# Patient Record
Sex: Female | Born: 1935 | Race: White | Hispanic: No | Smoking: Former smoker
Health system: Southern US, Community
[De-identification: ages and names within clinical notes are randomized; demographics above are authoritative.]

## PROBLEM LIST (undated history)

## (undated) ENCOUNTER — Emergency Department (HOSPITAL_COMMUNITY): Admission: EM | Payer: BC Managed Care – PPO | Source: Home / Self Care

## (undated) DIAGNOSIS — E785 Hyperlipidemia, unspecified: Secondary | ICD-10-CM

## (undated) DIAGNOSIS — I10 Essential (primary) hypertension: Secondary | ICD-10-CM

## (undated) DIAGNOSIS — E119 Type 2 diabetes mellitus without complications: Secondary | ICD-10-CM

## (undated) DIAGNOSIS — K922 Gastrointestinal hemorrhage, unspecified: Secondary | ICD-10-CM

## (undated) DIAGNOSIS — N189 Chronic kidney disease, unspecified: Secondary | ICD-10-CM

## (undated) DIAGNOSIS — I2510 Atherosclerotic heart disease of native coronary artery without angina pectoris: Secondary | ICD-10-CM

## (undated) HISTORY — DX: Essential (primary) hypertension: I10

## (undated) HISTORY — PX: ABDOMINAL HYSTERECTOMY: SHX81

## (undated) HISTORY — DX: Hyperlipidemia, unspecified: E78.5

## (undated) HISTORY — PX: CHOLECYSTECTOMY: SHX55

## (undated) HISTORY — DX: Type 2 diabetes mellitus without complications: E11.9

---

## 1999-08-13 ENCOUNTER — Encounter: Admission: RE | Admit: 1999-08-13 | Discharge: 1999-08-13 | Payer: Self-pay | Admitting: Family Medicine

## 1999-08-13 ENCOUNTER — Encounter: Payer: Self-pay | Admitting: Family Medicine

## 2000-09-30 ENCOUNTER — Encounter: Payer: Self-pay | Admitting: Family Medicine

## 2000-09-30 ENCOUNTER — Encounter: Admission: RE | Admit: 2000-09-30 | Discharge: 2000-09-30 | Payer: Self-pay | Admitting: Family Medicine

## 2001-10-13 ENCOUNTER — Encounter: Payer: Self-pay | Admitting: Family Medicine

## 2001-10-13 ENCOUNTER — Encounter: Admission: RE | Admit: 2001-10-13 | Discharge: 2001-10-13 | Payer: Self-pay | Admitting: Family Medicine

## 2002-11-23 ENCOUNTER — Encounter: Payer: Self-pay | Admitting: Family Medicine

## 2002-11-23 ENCOUNTER — Encounter: Admission: RE | Admit: 2002-11-23 | Discharge: 2002-11-23 | Payer: Self-pay | Admitting: Family Medicine

## 2004-01-17 ENCOUNTER — Ambulatory Visit (HOSPITAL_COMMUNITY): Admission: RE | Admit: 2004-01-17 | Discharge: 2004-01-17 | Payer: Self-pay | Admitting: Family Medicine

## 2005-07-08 ENCOUNTER — Ambulatory Visit (HOSPITAL_COMMUNITY): Admission: RE | Admit: 2005-07-08 | Discharge: 2005-07-08 | Payer: Self-pay | Admitting: Family Medicine

## 2013-06-22 ENCOUNTER — Ambulatory Visit: Payer: Self-pay | Admitting: *Deleted

## 2013-06-28 ENCOUNTER — Encounter: Payer: Self-pay | Admitting: *Deleted

## 2013-06-28 ENCOUNTER — Encounter: Payer: Medicare Other | Attending: Family Medicine | Admitting: *Deleted

## 2013-06-28 DIAGNOSIS — Z713 Dietary counseling and surveillance: Secondary | ICD-10-CM | POA: Insufficient documentation

## 2013-06-28 DIAGNOSIS — E119 Type 2 diabetes mellitus without complications: Secondary | ICD-10-CM | POA: Insufficient documentation

## 2013-06-28 NOTE — Progress Notes (Signed)
Appt start time: 1500 end time:  1630.  Assessment:  Patient was seen on  06/28/13 for individual diabetes education. Patient states she is new to diabetes, she does not have a meter yet and has not had any diabetes education before. She declined to be weighed today stating it makes her depressed. She states she tried to take Januvia but she had bad problem with GI upset. She doesn't want to take any oral diabetes medication after that problem. She is taking Glimeperide successfully so far. She declines any symptoms including polyuria and polydipsia. She does not get activity due to some dizziness and she does not enjoy being outside. She doesn't cook for herself due to small kitchen and concerns over smoke alarm going off in her apartment.  Current HbA1c: 11.4%  Preferred Learning Style:   No preference indicated   Learning Readiness:   Contemplating  MEDICATIONS: see list. Diabetes medication is Glimeperide  DIETARY INTAKE:  24-hr recall:  B ( AM): cereal (great grains with nuts and cranberries OR mini shredded wheat), 1-2% milk, coffee with creamer  Snk ( AM): pickles and Lance crackers L ( PM): varies, usually a sandwich with whole grain bread OR frozen dinner OR salad from [employer] with lots of vegetables in it, OR bowl of [employer] soup OR sweet iced tea with lemon Snk ( PM): no  D ( PM): meal brought home from [employer] OR frozen meal with meat, starch, vegetable and bread Snk ( PM): not usually Beverages: coffee, sweet tea, diet soda  Usual physical activity: none  Estimated energy needs: 1400 calories 158 g carbohydrates 105 g protein 39 g fat    Intervention:  Nutrition counseling attempteded. I initiated instruction but she became tired after about 45 minutes so we had to cut the visit short. She stated she would read the material in The Liviing Well With Diabetes Book and call me if she had any questions. Information covered today include:  Discussed diabetes disease process and  treatment options.  Discussed basic physiology of diabetes and role of obesity on insulin resistance.  Encouraged moderate weight reduction to improve glucose levels.  Discussed role of medications and diet in glucose control  Reviewed patient medications.  Discussed role of medication on blood glucose and possible side effects  Described short-term complications: hyper- and hypo-glycemia.  Discussed causes,symptoms, and treatment options.  Teaching Method Utilized: Visual and Auditory   Handouts given during visit include: Living Well with Diabetes  Barriers to learning/adherence to lifestyle change: elderly living alone and some denial to having diabetes  Diabetes self-care support plan:   [facility] support group  She depends on her daughter who lives out of state to provide her with information on her diabetes and other medical issues  Demonstrated degree of understanding via:  Teach Back   Monitoring/Evaluation:  Dietary intake, exercise, reading food labels, and body weight prn. She declined to make a follow up appointment but did take my card so she can contact me when she is ready to continue.

## 2016-09-07 ENCOUNTER — Encounter (HOSPITAL_COMMUNITY)
Admission: RE | Disposition: A | Discharge status: Home Health | Payer: Self-pay | Source: Ambulatory Visit | Attending: Interventional Cardiology

## 2016-09-07 ENCOUNTER — Inpatient Hospital Stay (HOSPITAL_COMMUNITY)
Admission: RE | Admit: 2016-09-07 | Discharge: 2016-09-13 | DRG: 246 | Disposition: A | Discharge status: Home Health | Payer: Medicare Other | Source: Ambulatory Visit | Attending: Interventional Cardiology | Admitting: Interventional Cardiology

## 2016-09-07 ENCOUNTER — Inpatient Hospital Stay (HOSPITAL_COMMUNITY): Payer: Medicare Other

## 2016-09-07 DIAGNOSIS — I959 Hypotension, unspecified: Secondary | ICD-10-CM | POA: Diagnosis not present

## 2016-09-07 DIAGNOSIS — Z79899 Other long term (current) drug therapy: Secondary | ICD-10-CM

## 2016-09-07 DIAGNOSIS — E872 Acidosis: Secondary | ICD-10-CM | POA: Diagnosis present

## 2016-09-07 DIAGNOSIS — N189 Chronic kidney disease, unspecified: Secondary | ICD-10-CM | POA: Diagnosis not present

## 2016-09-07 DIAGNOSIS — E1122 Type 2 diabetes mellitus with diabetic chronic kidney disease: Secondary | ICD-10-CM | POA: Diagnosis present

## 2016-09-07 DIAGNOSIS — R55 Syncope and collapse: Secondary | ICD-10-CM | POA: Diagnosis not present

## 2016-09-07 DIAGNOSIS — Z90710 Acquired absence of both cervix and uterus: Secondary | ICD-10-CM

## 2016-09-07 DIAGNOSIS — N179 Acute kidney failure, unspecified: Secondary | ICD-10-CM | POA: Diagnosis present

## 2016-09-07 DIAGNOSIS — Z794 Long term (current) use of insulin: Secondary | ICD-10-CM | POA: Diagnosis not present

## 2016-09-07 DIAGNOSIS — I2111 ST elevation (STEMI) myocardial infarction involving right coronary artery: Secondary | ICD-10-CM | POA: Diagnosis not present

## 2016-09-07 DIAGNOSIS — E785 Hyperlipidemia, unspecified: Secondary | ICD-10-CM | POA: Diagnosis present

## 2016-09-07 DIAGNOSIS — I2119 ST elevation (STEMI) myocardial infarction involving other coronary artery of inferior wall: Secondary | ICD-10-CM | POA: Diagnosis present

## 2016-09-07 DIAGNOSIS — K922 Gastrointestinal hemorrhage, unspecified: Secondary | ICD-10-CM | POA: Diagnosis present

## 2016-09-07 DIAGNOSIS — K219 Gastro-esophageal reflux disease without esophagitis: Secondary | ICD-10-CM | POA: Diagnosis present

## 2016-09-07 DIAGNOSIS — R110 Nausea: Secondary | ICD-10-CM | POA: Diagnosis present

## 2016-09-07 DIAGNOSIS — R001 Bradycardia, unspecified: Secondary | ICD-10-CM | POA: Diagnosis present

## 2016-09-07 DIAGNOSIS — K921 Melena: Secondary | ICD-10-CM | POA: Diagnosis not present

## 2016-09-07 DIAGNOSIS — E875 Hyperkalemia: Secondary | ICD-10-CM | POA: Diagnosis present

## 2016-09-07 DIAGNOSIS — Z9049 Acquired absence of other specified parts of digestive tract: Secondary | ICD-10-CM

## 2016-09-07 DIAGNOSIS — N170 Acute kidney failure with tubular necrosis: Secondary | ICD-10-CM

## 2016-09-07 DIAGNOSIS — Z6835 Body mass index (BMI) 35.0-35.9, adult: Secondary | ICD-10-CM

## 2016-09-07 DIAGNOSIS — R570 Cardiogenic shock: Secondary | ICD-10-CM | POA: Diagnosis present

## 2016-09-07 DIAGNOSIS — I2510 Atherosclerotic heart disease of native coronary artery without angina pectoris: Secondary | ICD-10-CM | POA: Diagnosis present

## 2016-09-07 DIAGNOSIS — E784 Other hyperlipidemia: Secondary | ICD-10-CM | POA: Diagnosis not present

## 2016-09-07 DIAGNOSIS — D62 Acute posthemorrhagic anemia: Secondary | ICD-10-CM | POA: Diagnosis not present

## 2016-09-07 DIAGNOSIS — N172 Acute kidney failure with medullary necrosis: Secondary | ICD-10-CM | POA: Diagnosis not present

## 2016-09-07 DIAGNOSIS — E871 Hypo-osmolality and hyponatremia: Secondary | ICD-10-CM | POA: Diagnosis present

## 2016-09-07 DIAGNOSIS — E669 Obesity, unspecified: Secondary | ICD-10-CM | POA: Diagnosis present

## 2016-09-07 DIAGNOSIS — Z955 Presence of coronary angioplasty implant and graft: Secondary | ICD-10-CM

## 2016-09-07 DIAGNOSIS — E118 Type 2 diabetes mellitus with unspecified complications: Secondary | ICD-10-CM | POA: Diagnosis not present

## 2016-09-07 DIAGNOSIS — N183 Chronic kidney disease, stage 3 (moderate): Secondary | ICD-10-CM

## 2016-09-07 DIAGNOSIS — R9431 Abnormal electrocardiogram [ECG] [EKG]: Secondary | ICD-10-CM | POA: Diagnosis not present

## 2016-09-07 DIAGNOSIS — E782 Mixed hyperlipidemia: Secondary | ICD-10-CM | POA: Diagnosis not present

## 2016-09-07 DIAGNOSIS — E1121 Type 2 diabetes mellitus with diabetic nephropathy: Secondary | ICD-10-CM | POA: Diagnosis not present

## 2016-09-07 DIAGNOSIS — D696 Thrombocytopenia, unspecified: Secondary | ICD-10-CM | POA: Diagnosis present

## 2016-09-07 DIAGNOSIS — I10 Essential (primary) hypertension: Secondary | ICD-10-CM | POA: Diagnosis not present

## 2016-09-07 DIAGNOSIS — E119 Type 2 diabetes mellitus without complications: Secondary | ICD-10-CM

## 2016-09-07 DIAGNOSIS — I219 Acute myocardial infarction, unspecified: Secondary | ICD-10-CM

## 2016-09-07 DIAGNOSIS — I129 Hypertensive chronic kidney disease with stage 1 through stage 4 chronic kidney disease, or unspecified chronic kidney disease: Secondary | ICD-10-CM | POA: Diagnosis present

## 2016-09-07 HISTORY — PX: CORONARY STENT INTERVENTION: CATH118234

## 2016-09-07 HISTORY — DX: Chronic kidney disease, unspecified: N18.9

## 2016-09-07 HISTORY — DX: Gastrointestinal hemorrhage, unspecified: K92.2

## 2016-09-07 HISTORY — DX: Atherosclerotic heart disease of native coronary artery without angina pectoris: I25.10

## 2016-09-07 HISTORY — PX: LEFT HEART CATH AND CORONARY ANGIOGRAPHY: CATH118249

## 2016-09-07 LAB — LIPID PANEL
Cholesterol: 165 mg/dL (ref 0–200)
HDL: 35 mg/dL — AB (ref >40)
LDL CALC: 107 mg/dL — AB (ref 0–99)
TRIGLYCERIDES: 114 mg/dL (ref <150)
Total CHOL/HDL Ratio: 4.7 RATIO
VLDL: 23 mg/dL (ref 0–40)

## 2016-09-07 LAB — COMPREHENSIVE METABOLIC PANEL
ALK PHOS: 55 U/L (ref 38–126)
ALT: 25 U/L (ref 14–54)
ANION GAP: 9 (ref 5–15)
AST: 118 U/L — ABNORMAL HIGH (ref 15–41)
Albumin: 3 g/dL — ABNORMAL LOW (ref 3.5–5.0)
BUN: 29 mg/dL — ABNORMAL HIGH (ref 6–20)
CO2: 18 mmol/L — AB (ref 22–32)
Calcium: 8 mg/dL — ABNORMAL LOW (ref 8.9–10.3)
Chloride: 100 mmol/L — ABNORMAL LOW (ref 101–111)
Creatinine, Ser: 2.85 mg/dL — ABNORMAL HIGH (ref 0.44–1.00)
GFR calc non Af Amer: 15 mL/min — ABNORMAL LOW (ref >60)
GFR, EST AFRICAN AMERICAN: 17 mL/min — AB (ref >60)
Glucose, Bld: 113 mg/dL — ABNORMAL HIGH (ref 65–99)
POTASSIUM: 4.7 mmol/L (ref 3.5–5.1)
SODIUM: 127 mmol/L — AB (ref 135–145)
Total Bilirubin: 0.4 mg/dL (ref 0.3–1.2)
Total Protein: 5.4 g/dL — ABNORMAL LOW (ref 6.5–8.1)

## 2016-09-07 LAB — BRAIN NATRIURETIC PEPTIDE: B Natriuretic Peptide: 406.2 pg/mL — ABNORMAL HIGH (ref 0.0–100.0)

## 2016-09-07 LAB — CBC
HEMATOCRIT: 28.7 % — AB (ref 36.0–46.0)
Hemoglobin: 9.6 g/dL — ABNORMAL LOW (ref 12.0–15.0)
MCH: 29.9 pg (ref 26.0–34.0)
MCHC: 33.4 g/dL (ref 30.0–36.0)
MCV: 89.4 fL (ref 78.0–100.0)
Platelets: 157 10*3/uL (ref 150–400)
RBC: 3.21 MIL/uL — ABNORMAL LOW (ref 3.87–5.11)
RDW: 14.4 % (ref 11.5–15.5)
WBC: 9.7 10*3/uL (ref 4.0–10.5)

## 2016-09-07 LAB — PROTIME-INR
INR: 1.44
Prothrombin Time: 17.7 seconds — ABNORMAL HIGH (ref 11.4–15.2)

## 2016-09-07 LAB — APTT: aPTT: >200 seconds (ref 24–36)

## 2016-09-07 LAB — TROPONIN I: Troponin I: 36.62 ng/mL (ref <0.03)

## 2016-09-07 SURGERY — LEFT HEART CATH AND CORONARY ANGIOGRAPHY
Anesthesia: LOCAL

## 2016-09-07 MED ORDER — TIROFIBAN (AGGRASTAT) BOLUS VIA INFUSION
INTRAVENOUS | Status: DC | PRN
  Administered 2016-09-07: 2175 ug via INTRAVENOUS

## 2016-09-07 MED ORDER — CLOPIDOGREL BISULFATE 75 MG PO TABS
75.0000 mg | ORAL_TABLET | Freq: Every day | ORAL | Status: DC
  Administered 2016-09-08 – 2016-09-13 (×6): 75 mg via ORAL
  Filled 2016-09-07 (×6): qty 1

## 2016-09-07 MED ORDER — SODIUM CHLORIDE 0.9 % IV SOLN
INTRAVENOUS | Status: AC | PRN
  Administered 2016-09-07: 250 mL/h via INTRAVENOUS

## 2016-09-07 MED ORDER — SODIUM CHLORIDE 0.9 % IV SOLN
250.0000 mL | INTRAVENOUS | Status: DC | PRN
  Administered 2016-09-09: 250 mL via INTRAVENOUS

## 2016-09-07 MED ORDER — ACETAMINOPHEN 325 MG PO TABS
650.0000 mg | ORAL_TABLET | ORAL | Status: DC | PRN
  Administered 2016-09-07 – 2016-09-11 (×3): 650 mg via ORAL
  Filled 2016-09-07 (×3): qty 2

## 2016-09-07 MED ORDER — SODIUM CHLORIDE 0.9 % IV SOLN
INTRAVENOUS | Status: AC
  Administered 2016-09-08: 05:00:00 via INTRAVENOUS

## 2016-09-07 MED ORDER — ONDANSETRON HCL 4 MG/2ML IJ SOLN
INTRAMUSCULAR | Status: DC | PRN
  Administered 2016-09-07: 4 mg via INTRAVENOUS

## 2016-09-07 MED ORDER — TIROFIBAN HCL IN NACL 5-0.9 MG/100ML-% IV SOLN
INTRAVENOUS | Status: AC | PRN
  Administered 2016-09-07: 0.075 ug/kg/min via INTRAVENOUS

## 2016-09-07 MED ORDER — LABETALOL HCL 5 MG/ML IV SOLN: 10.0000 mg | INTRAVENOUS | Status: AC | PRN

## 2016-09-07 MED ORDER — DOPAMINE-DEXTROSE 3.2-5 MG/ML-% IV SOLN
INTRAVENOUS | Status: AC | PRN
  Administered 2016-09-07: 3 ug/kg/min via INTRAVENOUS

## 2016-09-07 MED ORDER — ONDANSETRON HCL 4 MG/2ML IJ SOLN
4.0000 mg | Freq: Four times a day (QID) | INTRAMUSCULAR | Status: DC | PRN
  Administered 2016-09-08 (×3): 4 mg via INTRAVENOUS
  Filled 2016-09-07 (×3): qty 2

## 2016-09-07 MED ORDER — HEPARIN SODIUM (PORCINE) 1000 UNIT/ML IJ SOLN
INTRAMUSCULAR | Status: DC | PRN
  Administered 2016-09-07: 3000 [IU] via INTRAVENOUS
  Administered 2016-09-07: 5000 [IU] via INTRAVENOUS
  Administered 2016-09-07: 4000 [IU] via INTRAVENOUS

## 2016-09-07 MED ORDER — SODIUM CHLORIDE 0.9% FLUSH
3.0000 mL | Freq: Two times a day (BID) | INTRAVENOUS | Status: DC
  Administered 2016-09-08: 10 mL via INTRAVENOUS
  Administered 2016-09-08 – 2016-09-12 (×8): 3 mL via INTRAVENOUS

## 2016-09-07 MED ORDER — TIROFIBAN HCL IV 12.5 MG/250 ML
0.0750 ug/kg/min | INTRAVENOUS | Status: DC
  Administered 2016-09-08: 0.075 ug/kg/min via INTRAVENOUS
  Filled 2016-09-07: qty 250

## 2016-09-07 MED ORDER — MIDAZOLAM HCL 2 MG/2ML IJ SOLN
INTRAMUSCULAR | Status: DC | PRN
  Administered 2016-09-07: 1 mg via INTRAVENOUS

## 2016-09-07 MED ORDER — IOPAMIDOL (ISOVUE-370) INJECTION 76%
INTRAVENOUS | Status: DC | PRN
  Administered 2016-09-07: 105 mL via INTRA_ARTERIAL

## 2016-09-07 MED ORDER — CLOPIDOGREL BISULFATE 75 MG PO TABS
600.0000 mg | ORAL_TABLET | Freq: Once | ORAL | Status: AC
  Administered 2016-09-08: 600 mg via ORAL
  Filled 2016-09-07: qty 8

## 2016-09-07 MED ORDER — HEPARIN (PORCINE) IN NACL 2-0.9 UNIT/ML-% IJ SOLN
INTRAMUSCULAR | Status: AC | PRN
  Administered 2016-09-07: 1000 mL

## 2016-09-07 MED ORDER — ATROPINE SULFATE 1 MG/10ML IJ SOSY
PREFILLED_SYRINGE | INTRAMUSCULAR | Status: DC | PRN
  Administered 2016-09-07: 1 mg via INTRAVENOUS

## 2016-09-07 MED ORDER — SODIUM CHLORIDE 0.9% FLUSH: 3.0000 mL | INTRAVENOUS | Status: DC | PRN

## 2016-09-07 MED ORDER — OXYCODONE-ACETAMINOPHEN 5-325 MG PO TABS
1.0000 | ORAL_TABLET | ORAL | Status: DC | PRN
  Administered 2016-09-08 (×2): 1 via ORAL
  Filled 2016-09-07 (×2): qty 1

## 2016-09-07 MED ORDER — HEPARIN SODIUM (PORCINE) 5000 UNIT/ML IJ SOLN
5000.0000 [IU] | Freq: Three times a day (TID) | INTRAMUSCULAR | Status: DC
  Administered 2016-09-08: 5000 [IU] via SUBCUTANEOUS
  Filled 2016-09-07: qty 1

## 2016-09-07 MED ORDER — DOPAMINE-DEXTROSE 3.2-5 MG/ML-% IV SOLN
2.0000 ug/kg/min | INTRAVENOUS | Status: DC
  Administered 2016-09-08: 11.5 ug/kg/min via INTRAVENOUS
  Administered 2016-09-09: 9 ug/kg/min via INTRAVENOUS
  Administered 2016-09-09: 8 ug/kg/min via INTRAVENOUS
  Filled 2016-09-07 (×2): qty 250

## 2016-09-07 MED ORDER — VERAPAMIL HCL 2.5 MG/ML IV SOLN
INTRAVENOUS | Status: DC | PRN
  Administered 2016-09-07: 10 mL via INTRA_ARTERIAL

## 2016-09-07 MED ORDER — LIDOCAINE HCL (PF) 1 % IJ SOLN
INTRAMUSCULAR | Status: DC | PRN
  Administered 2016-09-07: 2 mL via INTRADERMAL

## 2016-09-07 MED ORDER — HYDRALAZINE HCL 20 MG/ML IJ SOLN: 5.0000 mg | INTRAMUSCULAR | Status: AC | PRN

## 2016-09-07 MED ORDER — ASPIRIN 81 MG PO CHEW
81.0000 mg | CHEWABLE_TABLET | Freq: Every day | ORAL | Status: DC
  Administered 2016-09-08: 81 mg via ORAL
  Filled 2016-09-07: qty 1

## 2016-09-07 SURGICAL SUPPLY — 17 items
BALLN EUPHORA RX 2.0X15 (BALLOONS) ×2
BALLOON EUPHORA RX 2.0X15 (BALLOONS) IMPLANT
CATH INFINITI 5 FR JL3.5 (CATHETERS) ×1 IMPLANT
CATH INFINITI JR4 5F (CATHETERS) ×1 IMPLANT
CATH VISTA GUIDE 6FR JR4 (CATHETERS) ×1 IMPLANT
DEVICE RAD COMP TR BAND LRG (VASCULAR PRODUCTS) ×1 IMPLANT
ELECT DEFIB PAD ADLT CADENCE (PAD) ×1 IMPLANT
GLIDESHEATH SLEND A-KIT 6F 22G (SHEATH) ×1 IMPLANT
GUIDEWIRE INQWIRE 1.5J.035X260 (WIRE) IMPLANT
INQWIRE 1.5J .035X260CM (WIRE) ×2
KIT ENCORE 26 ADVANTAGE (KITS) ×1 IMPLANT
KIT HEART LEFT (KITS) ×2 IMPLANT
PACK CARDIAC CATHETERIZATION (CUSTOM PROCEDURE TRAY) ×2 IMPLANT
STENT PROMUS PREM MR 3.5X24 (Permanent Stent) ×1 IMPLANT
TRANSDUCER W/STOPCOCK (MISCELLANEOUS) ×2 IMPLANT
TUBING CIL FLEX 10 FLL-RA (TUBING) ×2 IMPLANT
WIRE ASAHI PROWATER 180CM (WIRE) ×1 IMPLANT

## 2016-09-07 NOTE — H&P (Addendum)
Grace Romero is a 81 y.o. female  Admit Date: 09/07/2016 Referring Physician: EMS STEMI activation Primary Cardiologist:: New Chief complaint / reason for admission: Inferior STEMI   HPI: 81 year old female with no prior records available in our system presented via EMS as a code STEMI with inferior ST elevation and bradycardia.  At around 4 PM on 09/06/16, the patient developed epigastric, chest, and neck discomfort. She went to bed. She slept off and on. The discomfort lasted severely greater than 6 hours. By this morning, 12 hours later the discomfort had resolved but she felt weak. She continued to feel weak throughout the day and would nearly fainted when she would try to stand and ambulate. She called EMS who arrived, performed an EKG, and documented an inferior ST elevation. The patient was having mild residual neck and chest discomfort. She was met in the ambulance bay, appeared pale, was not dyspneic, but complained of hurting all over. She was taken directly to the cardiac catheterization laboratory.  Initial presentation to EMS was with blood pressure of 80 mmHg systolic which responded to 250 cc bolus of normal saline.    PMH:    Past Medical History:  Diagnosis Date  . Diabetes mellitus without complication   . Hyperlipidemia   . Hypertension     PSH:    Past Surgical History:  Procedure Laterality Date  . ABDOMINAL HYSTERECTOMY    . CHOLECYSTECTOMY     ALLERGIES:   Patient has no known allergies. Prior to Admit Meds:   Prescriptions Prior to Admission  Medication Sig Dispense Refill Last Dose  . amlodipine-benazepril (LOTREL) 2.5-10 MG per capsule Take 1 capsule by mouth daily.     Marland Kitchen glimepiride (AMARYL) 2 MG tablet Take 2 mg by mouth daily with breakfast.     . lisinopril (PRINIVIL,ZESTRIL) 20 MG tablet Take 20 mg by mouth daily.     Marland Kitchen omeprazole (PRILOSEC) 20 MG capsule Take 20 mg by mouth daily.     . simvastatin (ZOCOR) 20 MG tablet Take 20 mg by  mouth daily.     . verapamil (CALAN) 120 MG tablet Take 120 mg by mouth 3 (three) times daily.      Family HX:   No family history on file. Social HX:    Social History   Social History  . Marital status: Divorced    Spouse name: N/A  . Number of children: N/A  . Years of education: N/A   Occupational History  . Not on file.   Social History Main Topics  . Smoking status: Not on file  . Smokeless tobacco: Not on file  . Alcohol use Not on file  . Drug use: Unknown  . Sexual activity: Not on file   Other Topics Concern  . Not on file   Social History Narrative  . No narrative on file     ROS Smoke cigarettes. No history of COPD. Does not see her physician regularly. Mild nausea earlier today. Difficulty sleeping. Arthritis in both knees. All other systems are negative.  Physical Exam: Blood pressure (!) 119/53, pulse (!) 0, resp. rate (!) 0, height '5\' 2"'$  (1.575 m), weight 193 lb 2 oz (87.6 kg), SpO2 (!) 0 %.    The patient is pale, cool, and slightly clammy. Pupils equal and reactive to light. No jaundice is noted. Neck exam is difficult and there is no obvious JVD with the patient lying at 39 of the emergency gurney. No carotid bruits are heard.  Chest is clear to auscultation and percussion. Cardiac exam reveals a soft S4 gallop but otherwise unremarkable. No rub is heard. Abdomen is markedly obese. No tenderness is noted. Soft bowel sounds are heard. Extremities reveal no edema. Radial pulses are thready but palpable. Dorsalis pedis pulses are 2+ and symmetric. Neurologically, the patient is responsive, able to follow commands, and has no focal defects.   Labs: Lab Results  Component Value Date   WBC 9.7 09/07/2016   HGB 9.6 (L) 09/07/2016   HCT 28.7 (L) 09/07/2016   MCV 89.4 09/07/2016   PLT 157 09/07/2016   No results for input(s): NA, K, CL, CO2, BUN, CREATININE, CALCIUM, PROT, BILITOT, ALKPHOS, ALT, AST, GLUCOSE in the last 168 hours.  Invalid input(s):  LABALBU No results found for: CKTOTAL, [place], [place], [place]    Radiology:  No results found.  EKG:  Sinus bradycardia, ST elevation 2, 3, and aVF. ST elevation also noted in V6.  ASSESSMENT:  1. Late presenting inferior ST elevation myocardial infarction with ongoing relatively vague complaints. She has not greater than 24 hours post symptom onset. Perhaps continuing vague complaints are related to vessel occlusion with collaterals. 2. Diabetes mellitus, type II without much detail from the patient concerning control. 3. Tobacco abuse 4. Presumed hyperlipidemia 5. Initial hypotension, responding to IV normal saline bolus  Plan:  Diabetic patient with evidence of inferior infarction and ongoing vague complaints.  The patient was counseled to undergo left heart catheterization, coronary angiography, and possible percutaneous coronary intervention with stent implantation. The procedural risks and benefits were discussed in detail. The risks discussed included death, stroke, myocardial infarction, life-threatening bleeding, limb ischemia, kidney injury, allergy, and possible emergency cardiac surgery. The risk of these significant complications were estimated to occur less than 1% of the time. After discussion, the patient has agreed to proceed.  IV fluid will be given to combat tendency towards hypotension, possibly in the setting of right ventricular involvement.  Critical care time 35 minutes.  Belva Crome III 09/07/2016 10:42 PM

## 2016-09-08 ENCOUNTER — Encounter (HOSPITAL_COMMUNITY): Payer: Self-pay | Admitting: General Practice

## 2016-09-08 ENCOUNTER — Inpatient Hospital Stay (HOSPITAL_COMMUNITY): Payer: Medicare Other

## 2016-09-08 DIAGNOSIS — E1122 Type 2 diabetes mellitus with diabetic chronic kidney disease: Secondary | ICD-10-CM

## 2016-09-08 DIAGNOSIS — E784 Other hyperlipidemia: Secondary | ICD-10-CM

## 2016-09-08 DIAGNOSIS — R570 Cardiogenic shock: Secondary | ICD-10-CM | POA: Diagnosis present

## 2016-09-08 DIAGNOSIS — I10 Essential (primary) hypertension: Secondary | ICD-10-CM

## 2016-09-08 DIAGNOSIS — E871 Hypo-osmolality and hyponatremia: Secondary | ICD-10-CM

## 2016-09-08 DIAGNOSIS — R001 Bradycardia, unspecified: Secondary | ICD-10-CM

## 2016-09-08 DIAGNOSIS — R55 Syncope and collapse: Secondary | ICD-10-CM

## 2016-09-08 DIAGNOSIS — R9431 Abnormal electrocardiogram [ECG] [EKG]: Secondary | ICD-10-CM

## 2016-09-08 LAB — GLUCOSE, CAPILLARY
GLUCOSE-CAPILLARY: 141 mg/dL — AB (ref 65–99)
Glucose-Capillary: 215 mg/dL — ABNORMAL HIGH (ref 65–99)
Glucose-Capillary: 165 mg/dL — ABNORMAL HIGH (ref 65–99)

## 2016-09-08 LAB — CBC
HCT: 27.5 % — ABNORMAL LOW (ref 36.0–46.0)
Hemoglobin: 9.4 g/dL — ABNORMAL LOW (ref 12.0–15.0)
MCH: 30.6 pg (ref 26.0–34.0)
MCHC: 34.2 g/dL (ref 30.0–36.0)
MCV: 89.6 fL (ref 78.0–100.0)
Platelets: 225 10*3/uL (ref 150–400)
RBC: 3.07 MIL/uL — AB (ref 3.87–5.11)
RDW: 14.3 % (ref 11.5–15.5)
WBC: 17.4 10*3/uL — ABNORMAL HIGH (ref 4.0–10.5)

## 2016-09-08 LAB — BASIC METABOLIC PANEL
ANION GAP: 10 (ref 5–15)
BUN: 36 mg/dL — ABNORMAL HIGH (ref 6–20)
CALCIUM: 8.1 mg/dL — AB (ref 8.9–10.3)
CO2: 18 mmol/L — AB (ref 22–32)
Chloride: 105 mmol/L (ref 101–111)
Creatinine, Ser: 2.51 mg/dL — ABNORMAL HIGH (ref 0.44–1.00)
GFR calc Af Amer: 20 mL/min — ABNORMAL LOW (ref >60)
GFR calc non Af Amer: 17 mL/min — ABNORMAL LOW (ref >60)
GLUCOSE: 175 mg/dL — AB (ref 65–99)
Potassium: 5.1 mmol/L (ref 3.5–5.1)
Sodium: 133 mmol/L — ABNORMAL LOW (ref 135–145)

## 2016-09-08 LAB — POCT I-STAT 3, ART BLOOD GAS (G3+)
ACID-BASE DEFICIT: 6 mmol/L — AB (ref 0.0–2.0)
BICARBONATE: 18.3 mmol/L — AB (ref 20.0–28.0)
O2 Saturation: 96 %
PH ART: 7.377 (ref 7.350–7.450)
TCO2: 19 mmol/L (ref 0–100)
pCO2 arterial: 31.1 mmHg — ABNORMAL LOW (ref 32.0–48.0)
pO2, Arterial: 85 mmHg (ref 83.0–108.0)

## 2016-09-08 LAB — OCCULT BLOOD GASTRIC / DUODENUM (SPECIMEN CUP): OCCULT BLOOD, GASTRIC: POSITIVE — AB

## 2016-09-08 LAB — POCT I-STAT, CHEM 8
BUN: 32 mg/dL — ABNORMAL HIGH (ref 6–20)
BUN: 33 mg/dL — ABNORMAL HIGH (ref 6–20)
CALCIUM ION: 1.21 mmol/L (ref 1.15–1.40)
CREATININE: 2.5 mg/dL — AB (ref 0.44–1.00)
CREATININE: 2.6 mg/dL — AB (ref 0.44–1.00)
Calcium, Ion: 1.2 mmol/L (ref 1.15–1.40)
Chloride: 104 mmol/L (ref 101–111)
Chloride: 106 mmol/L (ref 101–111)
GLUCOSE: 121 mg/dL — AB (ref 65–99)
Glucose, Bld: 125 mg/dL — ABNORMAL HIGH (ref 65–99)
HCT: 25 % — ABNORMAL LOW (ref 36.0–46.0)
HEMATOCRIT: 28 % — AB (ref 36.0–46.0)
HEMOGLOBIN: 9.5 g/dL — AB (ref 12.0–15.0)
Hemoglobin: 8.5 g/dL — ABNORMAL LOW (ref 12.0–15.0)
Potassium: 4.9 mmol/L (ref 3.5–5.1)
Potassium: 5.1 mmol/L (ref 3.5–5.1)
SODIUM: 132 mmol/L — AB (ref 135–145)
Sodium: 131 mmol/L — ABNORMAL LOW (ref 135–145)
TCO2: 19 mmol/L (ref 0–100)
TCO2: 19 mmol/L (ref 0–100)

## 2016-09-08 LAB — ECHOCARDIOGRAM COMPLETE
Height: 62 in
WEIGHTICAEL: 3089.97 [oz_av]

## 2016-09-08 LAB — TROPONIN I
TROPONIN I: 44.69 ng/mL — AB (ref <0.03)
Troponin I: 45.01 ng/mL (ref <0.03)

## 2016-09-08 LAB — MRSA PCR SCREENING: MRSA by PCR: NEGATIVE

## 2016-09-08 LAB — POCT ACTIVATED CLOTTING TIME
ACTIVATED CLOTTING TIME: 191 s
ACTIVATED CLOTTING TIME: 241 s
Activated Clotting Time: 340 seconds

## 2016-09-08 LAB — PREPARE RBC (CROSSMATCH)

## 2016-09-08 LAB — HEMOGLOBIN AND HEMATOCRIT, BLOOD
HEMATOCRIT: 21 % — AB (ref 36.0–46.0)
HEMOGLOBIN: 7.1 g/dL — AB (ref 12.0–15.0)

## 2016-09-08 LAB — ABO/RH: ABO/RH(D): O POS

## 2016-09-08 MED ORDER — SODIUM CHLORIDE 0.9 % IV BOLUS (SEPSIS)
1000.0000 mL | Freq: Once | INTRAVENOUS | Status: AC
  Administered 2016-09-08: 1000 mL via INTRAVENOUS

## 2016-09-08 MED ORDER — ORAL CARE MOUTH RINSE
15.0000 mL | Freq: Two times a day (BID) | OROMUCOSAL | Status: DC
  Administered 2016-09-08 – 2016-09-12 (×6): 15 mL via OROMUCOSAL

## 2016-09-08 MED ORDER — HEPARIN SODIUM (PORCINE) 5000 UNIT/ML IJ SOLN
5000.0000 [IU] | Freq: Three times a day (TID) | INTRAMUSCULAR | Status: DC
  Filled 2016-09-08: qty 1

## 2016-09-08 MED ORDER — INSULIN ASPART 100 UNIT/ML ~~LOC~~ SOLN
0.0000 [IU] | Freq: Three times a day (TID) | SUBCUTANEOUS | Status: DC
  Administered 2016-09-08 – 2016-09-10 (×3): 3 [IU] via SUBCUTANEOUS
  Administered 2016-09-11 (×2): 2 [IU] via SUBCUTANEOUS
  Administered 2016-09-12 (×2): 3 [IU] via SUBCUTANEOUS

## 2016-09-08 MED ORDER — ATROPINE SULFATE 1 MG/10ML IJ SOSY
PREFILLED_SYRINGE | INTRAMUSCULAR | Status: AC
  Filled 2016-09-08: qty 10

## 2016-09-08 MED ORDER — SODIUM CHLORIDE 0.9 % IV SOLN
Freq: Once | INTRAVENOUS | Status: AC
  Administered 2016-09-08: 14:00:00 via INTRAVENOUS

## 2016-09-08 MED ORDER — ATORVASTATIN CALCIUM 40 MG PO TABS
40.0000 mg | ORAL_TABLET | Freq: Every day | ORAL | Status: DC
  Administered 2016-09-08 – 2016-09-12 (×5): 40 mg via ORAL
  Filled 2016-09-08 (×6): qty 1

## 2016-09-08 MED ORDER — METOCLOPRAMIDE HCL 5 MG/ML IJ SOLN
5.0000 mg | Freq: Four times a day (QID) | INTRAMUSCULAR | Status: DC | PRN
  Administered 2016-09-08 – 2016-09-09 (×2): 5 mg via INTRAVENOUS
  Filled 2016-09-08 (×2): qty 2

## 2016-09-08 MED ORDER — SODIUM CHLORIDE 0.9 % IV SOLN
INTRAVENOUS | Status: DC
  Administered 2016-09-08 – 2016-09-10 (×3): via INTRAVENOUS

## 2016-09-08 MED ORDER — SODIUM CHLORIDE 0.9 % IV BOLUS (SEPSIS)
500.0000 mL | Freq: Once | INTRAVENOUS | Status: AC
  Administered 2016-09-08: 500 mL via INTRAVENOUS

## 2016-09-08 NOTE — Progress Notes (Signed)
Upon rounding, this RN noticed that patient's heart rate was getting slower. Pt stated, " I just dont feel good all over." Pt nauseous. Pt then became unresponsive, sats dipped to the 50's, BP 96/35. Pt arousable to sternal rub. NP, MD made aware. MD, NP to bedside. MD  verbal order for 1 liter Saline bolus, stat EKG, type and screen and zofran iv. Will continue to monitor closely.   Glade Lloyd, RN

## 2016-09-08 NOTE — Progress Notes (Addendum)
Progress Note  Patient Name: Grace Romero Date of Encounter: 09/08/2016  Primary Cardiologist: New: Dr. Katrinka Blazing  Patient Profile     81 y.o. female With no prior cardiac history who presented via EMS as a code STEMI with inferior ST elevation and bradycardia on May 13 roughly 10:40 PM. Unfortunately her pain began at 4 PM the previous day with epigastric chest and neck discomfort. She tried to sleep it off, but symptoms persisted.  In the emergency room she was hypotensive with low pressure and 80 mmHg systolic that responded well to bolus of normal saline.  She was taken to cardiac catheter revision LAD which was found to have an occluded RCA with extensive thrombus. Despite PCI to the more proximal portion of the RCA, there is distal embolization with extensive thrombus in the entire RCA with no flow beyond the medication distally.  Subjective   Gradual improvement of her chest pain overnight, mostly troubled with intermittent nausea - now very hungry Breathing has improved. Just feels very tired have not gotten any sleep. Mild hiccups, that she says are occurring when she is hungry  Has required dopamine overnight with difficulty weaning due to bradycardia.   Inpatient Medications    Scheduled Meds: . aspirin  81 mg Oral Daily  . atorvastatin  40 mg Oral q1800  . clopidogrel  75 mg Oral Daily  . heparin  5,000 Units Subcutaneous Q8H  . mouth rinse  15 mL Mouth Rinse BID  . sodium chloride flush  3 mL Intravenous Q12H   Continuous Infusions: . sodium chloride    . DOPamine 12 mcg/kg/min (09/08/16 0913)  . sodium chloride 500 mL (09/08/16 0902)  . tirofiban 0.075 mcg/kg/min (09/08/16 0049)   PRN Meds: sodium chloride, acetaminophen, ondansetron (ZOFRAN) IV, oxyCODONE-acetaminophen, sodium chloride flush   Vital Signs    Vitals:   09/08/16 0909 09/08/16 0910 09/08/16 0911 09/08/16 0915  BP:    (!) 125/41  Pulse: (!) 55 (!) 50 (!) 52 (!) 59  Resp: 20 (!) 21 19  (!) 24  Temp:      TempSrc:      SpO2: 100% 100% 100% 100%  Weight:      Height:        Intake/Output Summary (Last 24 hours) at 09/08/16 0919 Last data filed at 09/08/16 0700  Gross per 24 hour  Intake          1322.95 ml  Output              295 ml  Net          1027.95 ml   Filed Weights   09/07/16 2232  Weight: 193 lb 2 oz (87.6 kg)    Telemetry    Mostly sinus bradycardia with rates in the 50s (went lower if dopamine was discontinued or reduced) - Personally Reviewed  ECG    Sinus bradycardia, rate 58 BPM. Inferior (II, III and aVF) ST elevations with now Q waves and biphasic T waves. ST depressions in I and aVL persisted. But now new Q waves with subtle ST elevation and biphasic T waves in V3 through V6. --New compared to last night. ST segment elevations in the infiltrates have minimally resolved. - Personally Reviewed  Physical Exam   GEN: Ill-appearing, nontoxic Neck: No JVD Cardiac: RRR, soft 1/6 systolic murmur at the left sternal border.  Soft S4 gallop. No rubs.Nondisplaced PMI. Pulses 2+ throughout. Respiratory: Clear to auscultation bilaterally anterior fields. Nonlabored. GI: Soft, nontender, non-distended  obese MS: No edema; No deformity. Neuro:  Nonfocal; awake and oriented 3. Follows commands. Answers questions properly. Psych: Normal affect   Labs    Chemistry Recent Labs Lab 09/07/16 2135 09/08/16 0404  NA 127* 133*  K 4.7 5.1  CL 100* 105  CO2 18* 18*  GLUCOSE 113* 175*  BUN 29* 36*  CREATININE 2.85* 2.51*  CALCIUM 8.0* 8.1*  PROT 5.4*  --   ALBUMIN 3.0*  --   AST 118*  --   ALT 25  --   ALKPHOS 55  --   BILITOT 0.4  --   GFRNONAA 15* 17*  GFRAA 17* 20*  ANIONGAP 9 10     Hematology Recent Labs Lab 09/07/16 2135 09/08/16 0404  WBC 9.7 17.4*  RBC 3.21* 3.07*  HGB 9.6* 9.4*  HCT 28.7* 27.5*  MCV 89.4 89.6  MCH 29.9 30.6  MCHC 33.4 34.2  RDW 14.4 14.3  PLT 157 225    Cardiac Enzymes Recent Labs Lab 09/07/16 2135  09/08/16 0404  TROPONINI 36.62* 44.69*   No results for input(s): TROPIPOC in the last 168 hours.   BNP Recent Labs Lab 09/07/16 2130  BNP 406.2*     DDimer No results for input(s): DDIMER in the last 168 hours.   Radiology    Dg Chest Port 1 View  Result Date: 09/08/2016 CLINICAL DATA:  Myocardial infarction. EXAM: PORTABLE CHEST 1 VIEW COMPARISON:  None. FINDINGS: Low lung volumes. Heart at the upper limits normal in size. Mediastinal contours are normal. Multiple overlying monitoring devices project over the chest. No pulmonary edema. No large pleural effusion or focal airspace disease. No pneumothorax. No acute osseous abnormality. IMPRESSION: Low lung volumes. Upper normal heart size. No evidence of congestive failure or acute abnormality. Electronically Signed   By: Rubye Oaks M.D.   On: 09/08/2016 00:24    Cardiac Studies    Bedside echo personally reviewed. EF appears to be preserved with some basal inferior hypokinesis. No signs of ischemic MR, aneurysmal dilation concerning for potential free wall rupture. The RV does appear to be dilated and hypokinetic consistent with RV infarct.  CARDIAC CATH 09/07/2016: For inferior STEMI: Proximal RCA thrombotic occlusion treated with Synergy DES 3.5 x 24 -> however there is significant thrombus in the RCA with distal embolization into the PDA and RPL with no flow beyond the bifurcation. --> Continued on IV Aggrastat. -- Images personally reviewed  Diagnostic Diagram      Post-Intervention Diagram                      Assessment & Plan    Principal Problem:   Acute ST elevation myocardial infarction (STEMI) of inferior wall (HCC) Active Problems:   Cardiogenic shock (HCC)   DM II (diabetes mellitus, type II), controlled (HCC)   Hyperlipidemia   Acute-on-chronic kidney injury (HCC)   Hypertension  Unfortunately based on delayed presentation, she is essentially completing her inferior infarct.. She still acting like an  RV infarct as well.  Still borderline hypotensive and bradycardic requiring dopamine for rate control. - Echo done this morning. Read pending (I personally reviewed the images and will review the report when available.)   Continuing Aggrastat as ordered for 18 hours - was planning to consider adding heparin, however she has had some coffee-ground emesis this morning. We will therefore need to discontinue Aggrastat  For now I would continue dopamine overnight and gradually start to wean as heart rates starts to recover. -  Would like to see heart rates over 50  500 mL bolus of normal saline  Antiemetics  Statin ordered along with aspirin and Plavix. - Converting from home simvastatin to atorvastatin 40 mg  Holding off on antihypertensives including her home ACE inhibitor/cast channel blocker and verapamil. We would probably not restart verapamil in the low beta blocker  We will need to monitor blood sugars - will probably need sliding scale insulin. Once she is more stable can restart Amaryl.  Creatinine somewhat improved from yesterday. We don't know her baseline. Follow trend, may need to consult nephrology.  Initial labs showed hyponatremia, now resolved. Borderline potassium level. We will continued to follow.   Patient is critically ill with evolving/completed inferior/RV infarct with cardiac shock and bradycardia requiring dopamine for vasopressor support. She has now had coffee-ground emesis. Multiple systems all requiring detailed medical decision making.  65 min total time   Signed, Bryan Lemma, MD  09/08/2016, 9:19 AM

## 2016-09-08 NOTE — Care Management Note (Signed)
Case Management Note Donn Pierini RN, BSN Unit 2W-Case Manager--- 2H coverage [phone]  Patient Details  Name: Grace Romero MRN: [id number] Date of Birth: Jan 29, 1936  Subjective/Objective:   Pt admitted with STEMI s/p cardiac cath                Action/Plan: PTA pt lived at home- CM to follow for d/c needs  Expected Discharge Date:  09/12/16               Expected Discharge Plan:     In-House Referral:     Discharge planning Services  CM Consult  Post Acute Care Choice:    Choice offered to:     DME Arranged:    DME Agency:     HH Arranged:    HH Agency:     Status of Service:  In process, will continue to follow  If discussed at Long Length of Stay Meetings, dates discussed:    Discharge Disposition:   Additional Comments:  Darrold Span, RN 09/08/2016, 11:18 AM

## 2016-09-08 NOTE — Progress Notes (Signed)
Paged cardiology with critical EKG results/changes from prior.  NP states she will come to bedside to evaluate.

## 2016-09-08 NOTE — Progress Notes (Signed)
.  EKG CRITICAL VALUE     12 lead EKG performed.  Critical value noted.  Fayrene Fearing,  RN notified.   SCALES-PRICE,Grace Romero, CCT 09/08/2016 7:11 AM

## 2016-09-08 NOTE — Progress Notes (Signed)
    Called to b/s for Code Blue called - ~1345.   Pt has been having persistent N/V (coffe ground emesis) this AM. After a significant episode, her HR went down to 30s & she "blacked out" - by the time of my arrival, her HR was back in the 50s on increased Dopamine dose.  Repeat Hgb 7.1 --> Type & cross with plan for 2 Units PRBC; Aggrastat off & will not start Heparin.  Would not relook cath as further intervention now prohibitive until bleeding stops. - already on ASA/Plavix for DES (may need to stop ASA). -Shock (completing RV Infarct) - I L bolus NS & blood, Dopamine. - Bradycardia - I suspect that she Vasovagal'ed with emesis & HR dropped with underlying Mena Pauls from RCA infarct -- Pacer Pads applied - if recurs, would consider Temp Wire - hoping to avoid pacing (in order to avoid entraining the LV & making her Pacer Dependent).  - ~20 min managing patient.  20 min family discussion with Dtr & son-in-law.   Explained that pt is "touch & go" for the next few days - explained plan noted above.    Bryan Lemma, M.D., M.S. Interventional Cardiologist   Pager # [phone] Phone # [phone] [address] [place], [place] [address]

## 2016-09-08 NOTE — Progress Notes (Signed)
Paged NP with critical EKG results. MD and NP at bedside.  Glade Lloyd, RN

## 2016-09-08 NOTE — Progress Notes (Signed)
Pt BG 215. NP paged and made aware.   Glade Lloyd, RN

## 2016-09-08 NOTE — Progress Notes (Signed)
  Echocardiogram 2D Echocardiogram has been performed.  Grace Romero 09/08/2016, 8:54 AM

## 2016-09-09 LAB — CBC
HCT: 29.2 % — ABNORMAL LOW (ref 36.0–46.0)
HCT: 31.9 % — ABNORMAL LOW (ref 36.0–46.0)
HEMOGLOBIN: 9.9 g/dL — AB (ref 12.0–15.0)
HEMOGLOBIN: 11 g/dL — AB (ref 12.0–15.0)
MCH: 29.7 pg (ref 26.0–34.0)
MCH: 29.8 pg (ref 26.0–34.0)
MCHC: 33.9 g/dL (ref 30.0–36.0)
MCHC: 34.5 g/dL (ref 30.0–36.0)
MCV: 87.7 fL (ref 78.0–100.0)
MCV: 86.4 fL (ref 78.0–100.0)
PLATELETS: 142 10*3/uL — AB (ref 150–400)
Platelets: 89 10*3/uL — ABNORMAL LOW (ref 150–400)
RBC: 3.33 MIL/uL — ABNORMAL LOW (ref 3.87–5.11)
RBC: 3.69 MIL/uL — AB (ref 3.87–5.11)
RDW: 14.3 % (ref 11.5–15.5)
RDW: 14.8 % (ref 11.5–15.5)
WBC: 18.2 10*3/uL — ABNORMAL HIGH (ref 4.0–10.5)
WBC: 13.7 10*3/uL — ABNORMAL HIGH (ref 4.0–10.5)

## 2016-09-09 LAB — DIFFERENTIAL
BASOS ABS: 0 10*3/uL (ref 0.0–0.1)
Basophils Relative: 0 %
EOS ABS: 0 10*3/uL (ref 0.0–0.7)
EOS PCT: 0 %
LYMPHS ABS: 3.5 10*3/uL (ref 0.7–4.0)
LYMPHS PCT: 19 %
MONOS PCT: 8 %
Monocytes Absolute: 1.5 10*3/uL — ABNORMAL HIGH (ref 0.1–1.0)
NEUTROS PCT: 72 %
Neutro Abs: 13.2 10*3/uL — ABNORMAL HIGH (ref 1.7–7.7)

## 2016-09-09 LAB — GLUCOSE, CAPILLARY
GLUCOSE-CAPILLARY: 144 mg/dL — AB (ref 65–99)
GLUCOSE-CAPILLARY: 160 mg/dL — AB (ref 65–99)
GLUCOSE-CAPILLARY: 121 mg/dL — AB (ref 65–99)
Glucose-Capillary: 114 mg/dL — ABNORMAL HIGH (ref 65–99)

## 2016-09-09 LAB — BASIC METABOLIC PANEL
Anion gap: 6 (ref 5–15)
BUN: 52 mg/dL — AB (ref 6–20)
CALCIUM: 7.7 mg/dL — AB (ref 8.9–10.3)
CO2: 16 mmol/L — AB (ref 22–32)
CREATININE: 2.53 mg/dL — AB (ref 0.44–1.00)
Chloride: 112 mmol/L — ABNORMAL HIGH (ref 101–111)
GFR calc Af Amer: 20 mL/min — ABNORMAL LOW (ref >60)
GFR, EST NON AFRICAN AMERICAN: 17 mL/min — AB (ref >60)
GLUCOSE: 162 mg/dL — AB (ref 65–99)
Potassium: 5.6 mmol/L — ABNORMAL HIGH (ref 3.5–5.1)
Sodium: 134 mmol/L — ABNORMAL LOW (ref 135–145)

## 2016-09-09 LAB — HEMOGLOBIN A1C
HEMOGLOBIN A1C: 6.8 % — AB (ref 4.8–5.6)
MEAN PLASMA GLUCOSE: 148 mg/dL

## 2016-09-09 LAB — PREPARE RBC (CROSSMATCH)

## 2016-09-09 MED ORDER — SODIUM CHLORIDE 0.9 % IV SOLN: INTRAVENOUS | Status: DC | PRN

## 2016-09-09 MED ORDER — SODIUM CHLORIDE 0.9 % IV BOLUS (SEPSIS)
500.0000 mL | Freq: Once | INTRAVENOUS | Status: AC
  Administered 2016-09-09: 500 mL via INTRAVENOUS

## 2016-09-09 MED ORDER — PANTOPRAZOLE SODIUM 40 MG PO TBEC
40.0000 mg | DELAYED_RELEASE_TABLET | Freq: Every day | ORAL | Status: DC
  Administered 2016-09-09: 40 mg via ORAL
  Filled 2016-09-09: qty 1

## 2016-09-09 MED ORDER — SODIUM CHLORIDE 0.9 % IV SOLN: Freq: Once | INTRAVENOUS | Status: DC

## 2016-09-09 MED ORDER — PANTOPRAZOLE SODIUM 40 MG PO TBEC
40.0000 mg | DELAYED_RELEASE_TABLET | Freq: Two times a day (BID) | ORAL | Status: DC
  Administered 2016-09-09 – 2016-09-13 (×8): 40 mg via ORAL
  Filled 2016-09-09 (×8): qty 1

## 2016-09-09 MED FILL — Nitroglycerin IV Soln 100 MCG/ML in D5W: INTRA_ARTERIAL | Qty: 10 | Status: AC

## 2016-09-09 NOTE — Progress Notes (Addendum)
Mrs Milliren has an RCA STEMI with active GI bleed. Currently on on plavix. Her course has been complicated by an RCA infarct. earlier yesterday she had an episode of bradycardia with  Loss of consciousness. Tonight around 2:30 she had another episode of loss of consciousness with a  HR drop to the 30's in the setting of being turned. The earlier episode was in the setting of nausea. These episodes do appear to be somewhat vasovagal but could also be explained by sinus node infarct. What speaks more for the vasovagal is that the last episode showed sinus brady followed by sinus tach. No evidence of conduction delay.Thus the blood pressure lowering witnessed during the episode (down to the 80's syst) could be a result of vasodilation as opposed to poor forward flow. Having said that she might benefit from a temp pacer but actually there is a good chance she might not respond adequately to pacing during a future episode given a high vagal tone involving the vasculature as well.   Anyhow, the patient at this time point is reluctant to get the temp pacer. She wants to wait for family to arrive at bedside at 7:30 am from [place].   We agreed to hold of cath lab activation. We wail place an A line. Keep the dopa at the lowest possible level. Given the GO bleed she already got 2 PRBC and we will give two more. Holding all blood thinners besides plavix for now.   Macario Golds, MD    ------   She had another episode around 3:20am. HR down to 40's. Witnessed by nurse. No obvious cause identified. Talked to on call cath attending Dr. Allyson Sabal. Decided to hold off temp perm at this time given that she is in cont NSR or sinus brady without loss of AV conduction. Most likely vasovagal episodes.

## 2016-09-09 NOTE — Progress Notes (Signed)
0230:  2 RNs and a tech were helping to get patient cleaned up from massive BM.  While patient was lying flat and turned patient's heart rate dropped into the 30s, she became hypotensive, and went unresponsive.  RNs assessed patient.  She had pulse. Elink MD and cardiology MD called.  Atropine 1/2 amp was given in the onset of emergency bradycardic episode.  Patient's HR and blood pressure responsive to intervention.  During turn RN noted that BM was large, dark blood.  Cardiology MD aware of this.  Cardiology MD gave orders for an a-line, 500 cc bolus, and 2 more units of PRBC to be given.  He also explained to patient and RN that a temp pacer may or may not be beneficial considering the source of bradycardia.  This does not seem to be a conduction problem.  This seems to be a vasovagal problem.  RN instructed to monitor patient and follow orders.  Hgb prior to the (now) 3rd unit of blood given was 9.9.  MD made aware of these results and instructed RN to transfuse both units of blood.    0320: This RN was with patient when she had bradycardic episode again (with no apparent reason).  Patient was sitting up in bed talking with this RN when she had this episode.  Patient became very somnolent again.  RN called for help.  Blood volume rate increased. Patient became more arousable and could orient appropriately, but seemed quite lethargic.  RN made Cardiolgy MD aware of this episode again.  MD called cath lab MD and notified him, but temp pacer was not chosen as course of action at this time due to the nature of the bradycardia.  RN instructed to monitor.

## 2016-09-09 NOTE — Consult Note (Signed)
[facility] Consult  Referring Provider: Gwynn Burly, DO  Primary Care Physician:  Shirlean Mylar, MD Primary Gastroenterologist: [facility]  Reason for Consultation:  Coffee-ground emesis, black stools, anemia requiring multiple units of blood transfusion  HPI: Grace Romero is a 81 y.o. Caucasian  female admitted on 09/07/2016 epigastric, chest and neck discomfort, secondary to late presenting inferior ST elevation MI. She is status post cardiac catheterization with PCI. She was on Aggrastat which has been discontinued and she continues to be on Plavix. Patient as well as her daughter at bedside report that she has experienced several episodes of nausea and coffee-ground emesis and as per her nurse she had 1 episode of dark stool yesterday questionable for melena. Patient has had a long-standing history with gastric acid reflux, but denies difficulty in swallowing or pain on swallowing. She denies any abdominal pain continues to have nausea. She has not had any further episodes of vomiting. Lowest hemoglobin was 7.1 yesterday, she has received 4 units of blood transfusion and hemoglobin today is 9.9.  Past Medical History:  Diagnosis Date  . Diabetes mellitus without complication (HCC)   . Hyperlipidemia   . Hypertension     Past Surgical History:  Procedure Laterality Date  . ABDOMINAL HYSTERECTOMY    . CHOLECYSTECTOMY    . CORONARY STENT INTERVENTION N/A 09/07/2016   Procedure: Coronary Stent Intervention;  Surgeon: Lyn Records, MD;  Location: [facility] INVASIVE CV LAB;  Service: Cardiovascular;  Laterality: N/A;  . LEFT HEART CATH AND CORONARY ANGIOGRAPHY N/A 09/07/2016   Procedure: Left Heart Cath and Coronary Angiography;  Surgeon: Lyn Records, MD;  Location: [facility] INVASIVE CV LAB;  Service: Cardiovascular;  Laterality: N/A;    Prior to Admission medications   Medication Sig Start Date End Date Taking? Authorizing Provider  cholecalciferol (VITAMIN D) 1000 units  tablet Take 2,000 Units by mouth daily.   Yes [provider]  glimepiride (AMARYL) 2 MG tablet Take 2 mg by mouth daily with breakfast.   Yes [provider]  Insulin Degludec (TRESIBA FLEXTOUCH [place]) Inject 51 Units into the skin every morning.   Yes [provider]  lisinopril (PRINIVIL,ZESTRIL) 20 MG tablet Take 20 mg by mouth daily.   Yes [provider]  omeprazole (PRILOSEC) 20 MG capsule Take 20 mg by mouth daily.   Yes [provider]  Potassium 75 MG TABS Take 37.5 mg by mouth daily.   Yes [provider]  Probiotic Product (PROBIOTIC PO) Take 1 tablet by mouth daily.   Yes [provider]  simvastatin (ZOCOR) 20 MG tablet Take 20 mg by mouth daily.   Yes [provider]  verapamil (VERELAN PM) 120 MG 24 hr capsule Take 120 mg by mouth every morning.   Yes [provider]    Current Facility-Administered Medications  Medication Dose Route Frequency Provider Last Rate Last Dose  . 0.9 %  sodium chloride infusion  250 mL Intravenous PRN Lyn Records, MD 10 mL/hr at 09/09/16 0315 250 mL at 09/09/16 0315  . 0.9 %  sodium chloride infusion   Intravenous Continuous Laverda Page B, NP 75 mL/hr at 09/09/16 0700    . 0.9 %  sodium chloride infusion   Intravenous Once Fudim, Marat, MD      . 0.9 %  sodium chloride infusion   Intra-arterial PRN Fudim, Marat, MD      . acetaminophen (TYLENOL) tablet 650 mg  650 mg Oral Q4H PRN Lyn Records, MD  650 mg at 09/07/16 2326  . atorvastatin (LIPITOR) tablet 40 mg  40 mg Oral q1800 Marykay Lex, MD   40 mg at 09/08/16 1829  . clopidogrel (PLAVIX) tablet 75 mg  75 mg Oral Daily Lyn Records, MD   75 mg at 09/09/16 1044  . DOPamine (INTROPIN) 800 mg in dextrose 5 % 250 mL (3.2 mg/mL) infusion  2-20 mcg/kg/min Intravenous Titrated Lyn Records, MD 13.1 mL/hr at 09/09/16 0643 8 mcg/kg/min at 09/09/16 0643  . insulin aspart (novoLOG) injection 0-15 Units  0-15 Units  Subcutaneous TID WC Laverda Page B, NP   3 Units at 09/08/16 1720  . MEDLINE mouth rinse  15 mL Mouth Rinse BID Lyn Records, MD   15 mL at 09/08/16 0538  . metoCLOPramide (REGLAN) injection 5 mg  5 mg Intravenous Q6H PRN Marykay Lex, MD   5 mg at 09/09/16 1044  . ondansetron (ZOFRAN) injection 4 mg  4 mg Intravenous Q6H PRN Lyn Records, MD   4 mg at 09/08/16 1353  . oxyCODONE-acetaminophen (PERCOCET/ROXICET) 5-325 MG per tablet 1-2 tablet  1-2 tablet Oral Q4H PRN Lyn Records, MD   1 tablet at 09/08/16 0456  . pantoprazole (PROTONIX) EC tablet 40 mg  40 mg Oral Daily Gwynn Burly, DO   40 mg at 09/09/16 1044  . sodium chloride flush (NS) 0.9 % injection 3 mL  3 mL Intravenous Q12H Lyn Records, MD   10 mL at 09/08/16 2100  . sodium chloride flush (NS) 0.9 % injection 3 mL  3 mL Intravenous PRN Lyn Records, MD        Allergies as of 09/07/2016  . (No Known Allergies)    No family history on file.  Social History   Social History  . Marital status: Divorced    Spouse name: N/A  . Number of children: N/A  . Years of education: N/A   Occupational History  . Not on file.   Social History Main Topics  . Smoking status: Not on file  . Smokeless tobacco: Not on file  . Alcohol use Not on file  . Drug use: Unknown  . Sexual activity: Not on file   Other Topics Concern  . Not on file   Social History Narrative  . No narrative on file    Review of Systems: Positive NGE:XBMWUX, vomiting coffee-ground material and dark stool GI: Described in detail in HPI.    Gen: Denies any fever, chills, rigors, night sweats, anorexia, fatigue, weakness, malaise, involuntary weight loss, and sleep disorder CV: Denies chest pain, angina, palpitations, syncope, orthopnea, PND, peripheral edema, and claudication. Resp: Denies dyspnea, cough, sputum, wheezing, coughing up blood. GU : Denies urinary burning, blood in urine, urinary frequency, urinary hesitancy, nocturnal  urination, and urinary incontinence. MS: Denies joint pain or swelling.  Denies muscle weakness, cramps, atrophy.  Derm: Denies rash, itching, oral ulcerations, hives, unhealing ulcers.  Psych: Denies depression, anxiety, memory loss, suicidal ideation, hallucinations,  and confusion. Heme: Denies bruising, bleeding, and enlarged lymph nodes. Neuro:  Denies any headaches, dizziness, paresthesias. Endo:  Denies any problems with DM, thyroid, adrenal function.  Physical Exam: Vital signs in last 24 hours: Temp:  [97.8 F (36.6 C)-99.6 F (37.6 C)] 98.2 F (36.8 C) (05/15 0739) Pulse Rate:  [53-88] 64 (05/15 0800) Resp:  [12-26] 19 (05/15 0800) BP: (64-151)/(30-82) 142/46 (05/15 0800) SpO2:  [98 %-100 %] 100 % (05/15 0800) Arterial Line BP: (74-169)/(43-87) 107/73 (05/15 0900)  Last BM Date: 09/08/16  General:   Alert,  Well-developed, well-nourished, pleasant and cooperative in NAD Head:  Normocephalic and atraumatic. Eyes:  Sclera clear, no icterus.  Mild pallor. Ears:  Normal auditory acuity. Nose:  No deformity, discharge,  or lesions. Mouth:  No deformity or lesions.  Oropharynx pink & moist. Neck:  Supple; no masses or thyromegaly. Lungs:  Clear throughout to auscultation.   No wheezes, crackles, or rhonchi. No acute distress. Heart:  Regular rate and rhythm; no murmurs, clicks, rubs,  or gallops. Extremities:  Without clubbing or edema. Neurologic:  Alert and  oriented x4;  grossly normal neurologically. Skin:  Intact without significant lesions or rashes. Psych:  Alert and cooperative. Normal mood and affect. Abdomen:  Soft, nontender and nondistended. No masses, hepatosplenomegaly or hernias noted. Normal bowel sounds, without guarding, and without rebound.         Lab Results:  Recent Labs  09/07/16 2135 09/08/16 0404 09/08/16 1207 09/09/16 0234  WBC 9.7 17.4*  --  18.2*  HGB 9.6* 9.4* 7.1* 9.9*  HCT 28.7* 27.5* 21.0* 29.2*  PLT 157 225  --  142*    BMET  Recent Labs  09/07/16 2135 09/08/16 0404 09/09/16 0234  NA 127* 133* 134*  K 4.7 5.1 5.6*  CL 100* 105 112*  CO2 18* 18* 16*  GLUCOSE 113* 175* 162*  BUN 29* 36* 52*  CREATININE 2.85* 2.51* 2.53*  CALCIUM 8.0* 8.1* 7.7*   LFT  Recent Labs  09/07/16 2135  PROT 5.4*  ALBUMIN 3.0*  AST 118*  ALT 25  ALKPHOS 55  BILITOT 0.4   PT/INR  Recent Labs  09/07/16 2135  LABPROT 17.7*  INR 1.44    Studies/Results: Dg Chest Port 1 View  Result Date: 09/08/2016 CLINICAL DATA:  Myocardial infarction. EXAM: PORTABLE CHEST 1 VIEW COMPARISON:  None. FINDINGS: Low lung volumes. Heart at the upper limits normal in size. Mediastinal contours are normal. Multiple overlying monitoring devices project over the chest. No pulmonary edema. No large pleural effusion or focal airspace disease. No pneumothorax. No acute osseous abnormality. IMPRESSION: Low lung volumes. Upper normal heart size. No evidence of congestive failure or acute abnormality. Electronically Signed   By: Rubye Oaks M.D.   On: 09/08/2016 00:24    Impression: Coffee-ground emesis, possible melena in a patient with a recent inferior STEMI, placement of a drug-eluting stent and cardiogenic shock on IV dopamine. This may be related to Mallory-Weiss tear or peptic ulcer disease.  Plan: Recommend H&H monitoring and monitor for hemodynamic stability. Patient currently on Protonix 40 mg by mouth daily, increase it to twice a day. I discussed the risk and benefits of a diagnostic EGD in details with the patient and her daughter at bedside. We came with the consensus,to observe and treat conservatively, unless there is further evidence of  rapid decline in hemoglobin or more episodes of coffee-ground emesis or melena. Her BUN is elevated but so is creatinine, with a low GFR. Okay to put the patient on clear liquid diet.   LOS: 2 days   @MEcred @  09/09/2016, 11:27 AM  Pager [phone] If no answer or after 5 PM  call [phone]

## 2016-09-09 NOTE — Progress Notes (Signed)
eLink Physician-Brief Progress Note Patient Name: Grace Romero DOB: March 26, 1936 MRN: [id number]   Date of Service  09/09/2016  HPI/Events of Note  Pt admitted as a STEMI (RCA).  Had LHC and PCI but with significant residual dse in R coronary  ELINK was called as pt went into bradycardia/ ? Junctional into the 40s.   Atropine IV was give and her HR picked up.  By the time I saw her, her heart rate was 86. Blood pressure 90/65, respiratory rate 23, 100% O2 saturation on 2 L.  On dopamine  She was complaining of back pains.   eICU Interventions  Cardiology has been updated.  Continuing to observe.  On dopamine drip.  Cont [place]. Not in distress.      Intervention Category Major Interventions: Other:  Daneen Schick Dios 09/09/2016, 2:38 AM

## 2016-09-09 NOTE — Procedures (Signed)
Arterial Catheter Insertion Procedure Note Grace Romero [id number] 1935-05-23  Procedure: Insertion of Arterial Catheter  Indications: Blood pressure monitoring  Procedure Details Consent: Risks of procedure as well as the alternatives and risks of each were explained to the (patient/caregiver).  Consent for procedure obtained. Time Out: Verified patient identification, verified procedure, site/side was marked, verified correct patient position, special equipment/implants available, medications/allergies/relevent history reviewed, required imaging and test results available.  Performed  Maximum sterile technique was used including antiseptics, cap, gloves, gown, hand hygiene, mask and sheet. Skin prep: Chlorhexidine; local anesthetic administered 20 gauge catheter was inserted into right radial artery using the Seldinger technique.  Evaluation Blood flow good; BP tracing good. Complications: No apparent complications. One attempt with two RT's, pt tolerated procedure well.    Tacy Learn 09/09/2016

## 2016-09-09 NOTE — Progress Notes (Addendum)
Progress Note  Patient Name: Grace Romero Date of Encounter: 09/09/2016  Primary Cardiologist: New: Dr. Katrinka Blazing  Patient Profile     81 y.o. female With no prior cardiac history who presented via EMS as a code STEMI with inferior ST elevation and bradycardia on May 13 roughly 10:40 PM. Unfortunately her pain began at 4 PM the previous day with epigastric chest and neck discomfort. She tried to sleep it off, but symptoms persisted.  In the emergency room she was hypotensive with low pressure and 80 mmHg systolic that responded well to bolus of normal saline.  She was taken to cardiac catheter revision LAD which was found to have an occluded RCA with extensive thrombus. Despite PCI to the more proximal portion of the RCA, there is distal embolization with extensive thrombus in the entire RCA with no flow beyond the medication distally.  Subjective   Overnight events reviewed, discussed with attending, and bedside RN.  She had 2 episodes of bradycardia felt to be vasovagal mediated and responded well to volume resuscitation. This morning, patient reports nausea is some better but has not tried eating much.  She has some discomfort over compression pad, feels tired, and feels like she needs sleep.   Inpatient Medications    Scheduled Meds: . atorvastatin  40 mg Oral q1800  . clopidogrel  75 mg Oral Daily  . insulin aspart  0-15 Units Subcutaneous TID WC  . mouth rinse  15 mL Mouth Rinse BID  . pantoprazole  40 mg Oral Daily  . sodium chloride flush  3 mL Intravenous Q12H   Continuous Infusions: . sodium chloride 250 mL (09/09/16 0315)  . sodium chloride 75 mL/hr at 09/09/16 0700  . sodium chloride    . sodium chloride    . DOPamine 8 mcg/kg/min (09/09/16 0643)   PRN Meds: sodium chloride, Place/Maintain arterial line **AND** sodium chloride, acetaminophen, metoCLOPramide (REGLAN) injection, ondansetron (ZOFRAN) IV, oxyCODONE-acetaminophen, sodium chloride flush   Vital  Signs    Vitals:   09/09/16 0645 09/09/16 0700 09/09/16 0739 09/09/16 0800  BP:  (!) 140/46  (!) 142/46  Pulse: (!) 56 (!) 55  64  Resp: 14 (!) 22  19  Temp:   98.2 F (36.8 C)   TempSrc:   Oral   SpO2: 100% 100%  100%  Weight:      Height:        Intake/Output Summary (Last 24 hours) at 09/09/16 0907 Last data filed at 09/09/16 0900  Gross per 24 hour  Intake          4701.15 ml  Output             1405 ml  Net          3296.15 ml   Filed Weights   09/07/16 2232  Weight: 193 lb 2 oz (87.6 kg)    Telemetry    Mostly sinus rhythm with some episodes of brady  ECG    No new EKG  Physical Exam   GEN: Ill-appearing, elderly female, lying in bed, nontoxic Neck: No JVD Cardiac: RRR, mild 1/6 systolic murmur at LUSB.  Peripheral pulses intact Respiratory: Clear to auscultation bilaterally anterior fields. Nonlabored.  On [place] GI: Soft, nontender, non-distended obese MS: No edema; No deformity. SCD in place Neuro:  Nonfocal; awake and oriented 3. Follows commands. Answers questions properly. Psych: Normal affect, somewhat depressed mood today  Labs    Chemistry  Recent Labs Lab 09/07/16 2135 09/08/16 0404 09/09/16 0234  NA 127* 133* 134*  K 4.7 5.1 5.6*  CL 100* 105 112*  CO2 18* 18* 16*  GLUCOSE 113* 175* 162*  BUN 29* 36* 52*  CREATININE 2.85* 2.51* 2.53*  CALCIUM 8.0* 8.1* 7.7*  PROT 5.4*  --   --   ALBUMIN 3.0*  --   --   AST 118*  --   --   ALT 25  --   --   ALKPHOS 55  --   --   BILITOT 0.4  --   --   GFRNONAA 15* 17* 17*  GFRAA 17* 20* 20*  ANIONGAP 9 10 6      Hematology  Recent Labs Lab 09/07/16 2135 09/08/16 0404 09/08/16 1207 09/09/16 0234  WBC 9.7 17.4*  --  18.2*  RBC 3.21* 3.07*  --  3.33*  HGB 9.6* 9.4* 7.1* 9.9*  HCT 28.7* 27.5* 21.0* 29.2*  MCV 89.4 89.6  --  87.7  MCH 29.9 30.6  --  29.7  MCHC 33.4 34.2  --  33.9  RDW 14.4 14.3  --  14.3  PLT 157 225  --  142*    Cardiac Enzymes  Recent Labs Lab 09/07/16 2135  09/08/16 0404 09/08/16 1207  TROPONINI 36.62* 44.69* 45.01*   No results for input(s): TROPIPOC in the last 168 hours.   BNP  Recent Labs Lab 09/07/16 2130  BNP 406.2*     DDimer No results for input(s): DDIMER in the last 168 hours.   Radiology    Dg Chest Port 1 View  Result Date: 09/08/2016 CLINICAL DATA:  Myocardial infarction. EXAM: PORTABLE CHEST 1 VIEW COMPARISON:  None. FINDINGS: Low lung volumes. Heart at the upper limits normal in size. Mediastinal contours are normal. Multiple overlying monitoring devices project over the chest. No pulmonary edema. No large pleural effusion or focal airspace disease. No pneumothorax. No acute osseous abnormality. IMPRESSION: Low lung volumes. Upper normal heart size. No evidence of congestive failure or acute abnormality. Electronically Signed   By: Rubye Oaks M.D.   On: 09/08/2016 00:24    Cardiac Studies   Echo: - Left ventricle: The cavity size was normal. There was moderate   focal basal hypertrophy of the septum. Systolic function was   vigorous. The estimated ejection fraction was in the range of 65%   to 70%. There is akinesis of the basal-midinferior myocardium.   Doppler parameters are consistent with abnormal left ventricular   relaxation (grade 1 diastolic dysfunction). - Aortic valve: Trileaflet; mildly thickened, mildly calcified   leaflets. There was mild regurgitation. - Right ventricle: The cavity size was mildly dilated. Wall   thickness was normal. Systolic function was moderately reduced. - Tricuspid valve: There was mild regurgitation.   CARDIAC CATH 09/07/2016: For inferior STEMI: Proximal RCA thrombotic occlusion treated with Synergy DES 3.5 x 24 -> however there is significant thrombus in the RCA with distal embolization into the PDA and RPL with no flow beyond the bifurcation. --> Continued on IV Aggrastat. -- Images personally reviewed  Diagnostic Diagram      Post-Intervention Diagram                       Assessment & Plan    Principal Problem:   Acute ST elevation myocardial infarction (STEMI) of inferior wall (HCC) Active Problems:   DM II (diabetes mellitus, type II), controlled (HCC)   Hypertension   Hyperlipidemia   Acute-on-chronic kidney injury (HCC)   Cardiogenic shock (HCC)  # Inferior  STEMI: Proximal RCA thrombotic occlusion treated with Synergy DES 3.5 x 24 with significant thrombus in the RCA with distal embolization # HLD # Cardiogenic Shock # Bradycardia # Nausea # Hx of HTN - episodes of bradycardia overnight, likely vasovagal, sensitive to volume repletion and dopamine infusion.  Remains on dopamine this morning.  Will attempt to titrate today as her HR allows.  Up to chair as tolerated today.  Continue antiemetics, IV fluids, supportive care as her infarct completes - continue arterial line for now - d/c SQ Heparin and continue SCD for DVT PPx - d/c Aspirin - Continue Plavix - Continue Statin - Holding off on antihypertensives - Incentive spirometry - Advance diet as tolerated  # Acute Blood Loss Anemia # GI Bleed - Aggrastat discontinued yesterday in setting of melena.  SQ heparin and aspirin held today.  Hemoglobin 9.9 this morning s/p 4 units of PRBCs - GI consult today for evaluation.  ? Consider EGD once more stable prior to discharge - add PPI  # Acute on Chronic Kidney Disease - admitted with Creatinine 2.6, peak 2.8.  Stable today at 2.5.  Unclear baseline level or degree of her renal insufficiency prior to admission.  Acute injury likely multifactorial given contrast, hypotension, and GI bleed - IV fluids - continue to monitor - may need to consult nephrology  # Hyperkalemia - mild, likely due to blood loss and renal insuffficiency - IV fluids - repeat morning BMET  # Non-Anion Gap Metabolic Acidosis - bicarb 16 today, anion gap 6.  Possibly secondary to normal saline infusion - consider change to LR - follow BMET  # Diabetes - on  sliding scale, CBGs are at reasonable values so far.  Will need to follow as she increases her dietary intake.   Signed, Gwynn Burly, DO  09/09/2016, 9:07 AM    I have seen, examined and evaluated the patient this AM along with Dr. Earlene Plater on morning rounds.  After reviewing all the available data and chart, we discussed the patients laboratory, study & physical findings as well as symptoms in detail. I agree with his findings, examination as well as impression recommendations as per our discussion.   She remains critically ill, completing inferior/RV infarct that is now complicated by postprocedural GI bleed in the setting of Aggrastat infusion. He has now received 4 units packed red blood cells.   Very complicated episodes last night and yesterday vasovagal hypotension and bradycardia complicated by large melanotic stool. Suspect that she had a vagal event as result of the melanotic stool. I agree with not going forward with temporary Pacemaker. --> For now we'll continue dopamine more for rate control and blood pressure improvement. She responds well to volume.  - Restarting IV fluids until she starts taking by mouth  She did get 2 more units of packed red blood cells and her hemoglobin is increased appreciably. We started a PPI and per GI recommendations they increased to twice a day. Still having some nausea but much improved with Reglan plus Zofran. -- Appreciate GI input I agree with holding off EGD for now unless she has recurrent episodes. I suspect this is probably gastritis related  We have stopped aspirin and subcutaneous heparin continuing Plavix for recent stent.  Renal function and anything appears to be stable despite her anemia. She is now starting to make urine. - I'm not sure how much more nephrology will provide with input at this time. We'll continue to monitor her creatinine levels. . The patient remains  chronically ill, multiple organ system damage requiring complex  medical decision making. Critical care time directly with patient 30 minutes, 30 minutes with chart review and charting.  Total time 60 minutes  Bryan Lemma, M.D., M.S. Interventional Cardiologist   Pager # [phone] Phone # [phone] [address] [place], [place] [address]

## 2016-09-10 ENCOUNTER — Encounter (HOSPITAL_COMMUNITY): Payer: Self-pay | Admitting: Interventional Cardiology

## 2016-09-10 DIAGNOSIS — E1121 Type 2 diabetes mellitus with diabetic nephropathy: Secondary | ICD-10-CM

## 2016-09-10 DIAGNOSIS — N172 Acute kidney failure with medullary necrosis: Secondary | ICD-10-CM

## 2016-09-10 DIAGNOSIS — N189 Chronic kidney disease, unspecified: Secondary | ICD-10-CM

## 2016-09-10 LAB — BPAM RBC
BLOOD PRODUCT EXPIRATION DATE: 201805302359
BLOOD PRODUCT EXPIRATION DATE: 201806052359
BLOOD PRODUCT EXPIRATION DATE: 201806052359
BLOOD PRODUCT EXPIRATION DATE: 201806072359
ISSUE DATE / TIME: 201805142020
ISSUE DATE / TIME: 201805150456
ISSUE DATE / TIME: 201805141625
ISSUE DATE / TIME: 201805150256
UNIT TYPE AND RH: 5100
Unit Type and Rh: 5100
Unit Type and Rh: 5100
Unit Type and Rh: 5100

## 2016-09-10 LAB — TYPE AND SCREEN
ABO/RH(D): O POS
ANTIBODY SCREEN: NEGATIVE
UNIT DIVISION: 0
Unit division: 0
Unit division: 0
Unit division: 0

## 2016-09-10 LAB — CBC
HEMATOCRIT: 31.3 % — AB (ref 36.0–46.0)
HEMOGLOBIN: 10.8 g/dL — AB (ref 12.0–15.0)
MCH: 30.2 pg (ref 26.0–34.0)
MCHC: 34.5 g/dL (ref 30.0–36.0)
MCV: 87.4 fL (ref 78.0–100.0)
Platelets: 88 10*3/uL — ABNORMAL LOW (ref 150–400)
RBC: 3.58 MIL/uL — AB (ref 3.87–5.11)
RDW: 15.2 % (ref 11.5–15.5)
WBC: 10.8 10*3/uL — ABNORMAL HIGH (ref 4.0–10.5)

## 2016-09-10 LAB — GLUCOSE, CAPILLARY
GLUCOSE-CAPILLARY: 107 mg/dL — AB (ref 65–99)
Glucose-Capillary: 155 mg/dL — ABNORMAL HIGH (ref 65–99)
Glucose-Capillary: 86 mg/dL (ref 65–99)
Glucose-Capillary: 155 mg/dL — ABNORMAL HIGH (ref 65–99)

## 2016-09-10 LAB — BASIC METABOLIC PANEL
ANION GAP: 5 (ref 5–15)
BUN: 49 mg/dL — ABNORMAL HIGH (ref 6–20)
CALCIUM: 7.8 mg/dL — AB (ref 8.9–10.3)
CO2: 16 mmol/L — AB (ref 22–32)
Chloride: 116 mmol/L — ABNORMAL HIGH (ref 101–111)
Creatinine, Ser: 2.07 mg/dL — ABNORMAL HIGH (ref 0.44–1.00)
GFR, EST AFRICAN AMERICAN: 25 mL/min — AB (ref >60)
GFR, EST NON AFRICAN AMERICAN: 22 mL/min — AB (ref >60)
GLUCOSE: 127 mg/dL — AB (ref 65–99)
POTASSIUM: 4.9 mmol/L (ref 3.5–5.1)
Sodium: 137 mmol/L (ref 135–145)

## 2016-09-10 MED ORDER — ASPIRIN EC 81 MG PO TBEC
81.0000 mg | DELAYED_RELEASE_TABLET | Freq: Every day | ORAL | Status: DC
  Administered 2016-09-10 – 2016-09-13 (×4): 81 mg via ORAL
  Filled 2016-09-10 (×4): qty 1

## 2016-09-10 NOTE — Progress Notes (Addendum)
Progress Note  Patient Name: Grace Romero Train Date of Encounter: 09/10/2016  Primary Cardiologist: New: Dr. Katrinka Blazing  Patient Profile     81 y.o. female With no prior cardiac history who presented via EMS as a code STEMI with inferior ST elevation and bradycardia on May 13 roughly 10:40 PM. Unfortunately her pain began at 4 PM the previous day with epigastric chest and neck discomfort. She tried to sleep it off, but symptoms persisted.  In the emergency room she was hypotensive with low pressure and 80 mmHg systolic that responded well to bolus of normal saline.  She was taken to cardiac catheter revision LAD which was found to have an occluded RCA with extensive thrombus. Despite PCI to the more proximal portion of the RCA, there is distal embolization with extensive thrombus in the entire RCA with no flow beyond the medication distally.  Subjective   Seen and examined this morning.  No acute events noted overnight. Reports feeling hungry and slept well. No further bleeding or nausea.  Reports getting up to chair yesterday.   Inpatient Medications    Scheduled Meds: . aspirin EC  81 mg Oral Daily  . atorvastatin  40 mg Oral q1800  . clopidogrel  75 mg Oral Daily  . insulin aspart  0-15 Units Subcutaneous TID WC  . mouth rinse  15 mL Mouth Rinse BID  . pantoprazole  40 mg Oral BID  . sodium chloride flush  3 mL Intravenous Q12H   Continuous Infusions: . sodium chloride Stopped (09/09/16 2100)  . sodium chloride    . sodium chloride    . DOPamine 1 mcg/kg/min (09/10/16 0859)   PRN Meds: sodium chloride, Place/Maintain arterial line **AND** sodium chloride, acetaminophen, metoCLOPramide (REGLAN) injection, ondansetron (ZOFRAN) IV, oxyCODONE-acetaminophen, sodium chloride flush   Vital Signs    Vitals:   09/10/16 0600 09/10/16 0700 09/10/16 0800 09/10/16 0900  BP: (!) 128/45 (!) 102/41 (!) 106/48 (!) 97/59  Pulse: 84 68 69 69  Resp: (!) 21 20 17  (!) 21  Temp:  97.5 F  (36.4 C)    TempSrc:  Oral    SpO2: 99% 100% 100% 100%  Weight:      Height:        Intake/Output Summary (Last 24 hours) at 09/10/16 0936 Last data filed at 09/10/16 0800  Gross per 24 hour  Intake          2253.57 ml  Output             1725 ml  Net           528.57 ml   Filed Weights   09/07/16 2232  Weight: 193 lb 2 oz (87.6 kg)    ECG    No new EKG this morning  Physical Exam   GEN: elderly female, sitting up in chair, eating breakfast, appears better today -  Neck: No JVD Cardiac: RRR, mild 1/6 systolic murmur at LUSB.  Respiratory: Clear to auscultation bilaterally anterior fields. Nonlabored.  On [place] GI: Soft,NT/ND/NABS, obese MS: No LE edema, mild R hand swelling (improved) Neuro:  Nonfocal; awake and oriented 3. Follows commands. Answers questions properly. Psych: Normal affect, improved mood today In better spirits.  Labs    Chemistry  Recent Labs Lab 09/07/16 2135 09/08/16 0404 09/09/16 0234 09/10/16 0324  NA 127* 133* 134* 137  K 4.7 5.1 5.6* 4.9  CL 100* 105 112* 116*  CO2 18* 18* 16* 16*  GLUCOSE 113* 175* 162* 127*  BUN  29* 36* 52* 49*  CREATININE 2.85* 2.51* 2.53* 2.07*  CALCIUM 8.0* 8.1* 7.7* 7.8*  PROT 5.4*  --   --   --   ALBUMIN 3.0*  --   --   --   AST 118*  --   --   --   ALT 25  --   --   --   ALKPHOS 55  --   --   --   BILITOT 0.4  --   --   --   GFRNONAA 15* 17* 17* 22*  GFRAA 17* 20* 20* 25*  ANIONGAP 9 10 6 5      Hematology  Recent Labs Lab 09/09/16 0234 09/09/16 1411 09/10/16 0324  WBC 18.2* 13.7* 10.8*  RBC 3.33* 3.69* 3.58*  HGB 9.9* 11.0* 10.8*  HCT 29.2* 31.9* 31.3*  MCV 87.7 86.4 87.4  MCH 29.7 29.8 30.2  MCHC 33.9 34.5 34.5  RDW 14.3 14.8 15.2  PLT 142* 89* 88*    Cardiac Enzymes  Recent Labs Lab 09/07/16 2135 09/08/16 0404 09/08/16 1207  TROPONINI 36.62* 44.69* 45.01*   No results for input(s): TROPIPOC in the last 168 hours.   BNP  Recent Labs Lab 09/07/16 2130  BNP 406.2*      DDimer No results for input(s): DDIMER in the last 168 hours.   Radiology    No results found.  Cardiac Studies   Echo: - Left ventricle: The cavity size was normal. There was moderate   focal basal hypertrophy of the septum. Systolic function was   vigorous. The estimated ejection fraction was in the range of 65%   to 70%. There is akinesis of the basal-midinferior myocardium.   Doppler parameters are consistent with abnormal left ventricular   relaxation (grade 1 diastolic dysfunction). - Aortic valve: Trileaflet; mildly thickened, mildly calcified   leaflets. There was mild regurgitation. - Right ventricle: The cavity size was mildly dilated. Wall   thickness was normal. Systolic function was moderately reduced. - Tricuspid valve: There was mild regurgitation.   CARDIAC CATH 09/07/2016: For inferior STEMI: Proximal RCA thrombotic occlusion treated with Synergy DES 3.5 x 24 -> however there is significant thrombus in the RCA with distal embolization into the PDA and RPL with no flow beyond the bifurcation. --> Continued on IV Aggrastat. -- Images personally reviewed  Diagnostic Diagram      Post-Intervention Diagram                      Assessment & Plan    Principal Problem:   Acute ST elevation myocardial infarction (STEMI) of inferior wall (HCC) Active Problems:   DM II (diabetes mellitus, type II), controlled (HCC)   Hypertension   Hyperlipidemia   Acute-on-chronic kidney injury (HCC)   Cardiogenic shock (HCC)  # Inferior STEMI: Proximal RCA thrombotic occlusion treated with Synergy DES 3.5 x 24 with significant thrombus in the RCA with distal embolization showing improvement from yesterday, remains on dopamine infusion, IV fluids, as needed antiemetics, Plavix, and Statin # HLD  # Cardiogenic Shock - improved # Bradycardia - HR now in 60-70s # Hx of HTN - continue to wean dopamine today.  MAP goal 60, systolic BP 90 - d/c IV fluids - continue as needed  antiemetics - d/c Foley - initiate cardiac rehab - continue Plavix - resume Aspirin, monitor for bleeding - off heparin - holding beta blocker until she proves not to be bradycardic off Dopamine - holding Ace/ARB given her kidney injury - keep  in ICU today  # Acute Blood Loss Anemia # GI Bleed - hemoglobin stable this morning.  Monitoring. - GI evaluation appreciated.  Holding off EGD for now unless there is evidence of rapid decline - continue PPI BID  # Thrombocytopenia - platelets this morning are 88.  Stable from yesterday but overall down from 157 at admission.  Suspect some dilution due to blood transfusion and administration of IV fluids.  Do not currently suspect HIT given timeframe.  Will continue to follow.  No evidence of further bleeding.  # Nausea - improved - continue as needed antiemetics  # Acute on Chronic Kidney Disease  - admitted with Creatinine 2.6, peak 2.8.  Improved to 2.07 today.  Unclear baseline level or degree of her renal insufficiency prior to admission.  Acute injury likely multifactorial given contrast, hypotension, and GI bleed.  Now making good UOP - d/c IV fluids as she is now taking in PO - continue to monitor closely post-diuresis  # Hyperkalemia - resolved this morning - follow  # Non-Anion Gap Metabolic Acidosis - Stable.  Likely secondary to normal saline infusion - follow, expect improvement with d/c fluids  # Diabetes - on sliding scale, CBGs are acceptable.  Follow as her intake improves   Signed, Gwynn Burly, DO  09/10/2016, 9:36 AM    I have seen, examined and evaluated the patient this AM along with Dr. Earlene Plater, R-2 on morning rounds.  After reviewing all the available data and chart, we discussed the patients laboratory, study & physical findings as well as symptoms in detail. I agree with his findings, examination as well as impression recommendations as per our discussion.    Tiawana looks much better today. She is in  much better spirits. Was stable overnight with no acute events maintained on very low-dose dopamine. We will wean this off over the next few hours provided her blood pressure and heart rate will tolerate. Her hemoglobin level is improved and stable with no further GI bleeding. She also has improvement of her renal function and potassium levels. Creatinine is down to 2 with improved urine output. We need to monitor for potential overdiuresis in the setting of possible ATN  Plan:  wean off dopamine and IV fluids  Cardiac rehabilitation plus minus physical therapy evaluation treatment today.  Advance diet as tolerated.  Restart aspirin along with Plavix  If stable over the course today, will transition either to stepdown or telemetry floor tomorrow depending on how well she doing overnight.  Total M.D. time with patient for initial visit was 30 minutes.   ADDENDUM: I went back to reevaluate the patient this evening prior to leaving. She was sitting up in the chair, eating a meal with her daughter. She was notably improved improved symptomatically even from this morning. In much better spirits. Blood pressures are stable now on the one teens with heart rates in the 70s off of dopamine. I spent about any additional minutes with the patient and her daughter after the original rounding this morning. We spent the entire time of those additional 20 minutes discussing for improvement of symptoms and lab values gradual progression of her condition and the future plans. We also discussed potential discharge issues with her maybe needing to move up with her daughter to [place] for short-term. We discussed the possibility of short-term rehabilitation on discharge following her hospital stay. These will be she is to continue just with social work when she moves out of the ICU. Get a better  sense of when she will be ready to discharge which I'm anticipating being probably over the weekend.   Bryan Lemma, M.D., M.S. Interventional Cardiologist   Pager # [phone] Phone # [phone] [address] [place], [place] [address]

## 2016-09-10 NOTE — Progress Notes (Signed)
CARDIAC REHAB PHASE I   PRE:  Rate/Rhythm: 70 SR  BP:  Sitting: 114/46        SaO2: 100 RA  MODE:  Ambulation: 20 ft   POST:  Rate/Rhythm: 85 SR  BP:  Sitting: 132/48         SaO2: 100 RA  Pt in bed, incontinent of stool, assisted to clean up. Pt agreeable to walk with much encouragement. Pt required assistance oob, states "I can't move my legs." Pt stood with min assist. Pt ambulated 20 ft on RA, rolling walker, gait belt, assist x2, very slow, steady gait, tolerated fair. Pt denies any specific complaints other than general fatigue, c/o of soreness in her R wrist. Pt to recliner after walk, required assistance to sit deep in the chair. Pt somewhat anxious, deconditioned, needs much encouragement.  Pt would benefit from PT consult in addition to cardiac reahb to maximize mobility as pt lives alone. Pt in chair, eating lunch, call bell within reach. Will follow as x2.   [id number] Joylene Grapes, RN, BSN 09/10/2016 2:11 PM

## 2016-09-10 NOTE — Progress Notes (Signed)
Subjective: Patient was seen and examined at bedside. She notes that her stools today were not dark. She denies abdominal pain.  Objective: Vital signs in last 24 hours: Temp:  [97.5 F (36.4 C)-99.2 F (37.3 C)] 98.1 F (36.7 C) (05/16 1100) Pulse Rate:  [57-84] 69 (05/16 0900) Resp:  [17-28] 21 (05/16 0900) BP: (85-150)/(38-59) 97/59 (05/16 0900) SpO2:  [99 %-100 %] 100 % (05/16 0900) Weight change:  Last BM Date: 09/10/16  PE: Not in acute distress GENERAL: Mild pallor, no obvious icterus ABDOMEN: Soft, nondistended, normoactive bowel sounds   Lab Results: Results for orders placed or performed during the hospital encounter of 09/07/16 (from the past 48 hour(s))  Prepare RBC     Status: None   Collection Time: 09/08/16  2:44 PM  Result Value Ref Range   Order Confirmation ORDER PROCESSED BY BLOOD BANK   Type and screen [place]     Status: None   Collection Time: 09/08/16  2:50 PM  Result Value Ref Range   ABO/RH(D) O POS    Antibody Screen NEG    Sample Expiration 09/11/2016    Unit Number [id number]    Blood Component Type RED CELLS,LR    Unit division 00    Status of Unit ISSUED,FINAL    Transfusion Status OK TO TRANSFUSE    Crossmatch Result Compatible    Unit Number [id number]    Blood Component Type RED CELLS,LR    Unit division 00    Status of Unit ISSUED,FINAL    Transfusion Status OK TO TRANSFUSE    Crossmatch Result Compatible    Unit Number [id number]    Blood Component Type RED CELLS,LR    Unit division 00    Status of Unit ISSUED,FINAL    Transfusion Status OK TO TRANSFUSE    Crossmatch Result Compatible    Unit Number [id number]    Blood Component Type RED CELLS,LR    Unit division 00    Status of Unit ISSUED,FINAL    Transfusion Status OK TO TRANSFUSE    Crossmatch Result Compatible   ABO/Rh     Status: None   Collection Time: 09/08/16  2:50 PM  Result Value Ref Range   ABO/RH(D) O POS   Glucose, capillary      Status: Abnormal   Collection Time: 09/08/16  4:36 PM  Result Value Ref Range   Glucose-Capillary 165 (H) 65 - 99 mg/dL   Comment 1 Notify RN   Glucose, capillary     Status: Abnormal   Collection Time: 09/08/16  9:32 PM  Result Value Ref Range   Glucose-Capillary 141 (H) 65 - 99 mg/dL  Basic metabolic panel     Status: Abnormal   Collection Time: 09/09/16  2:34 AM  Result Value Ref Range   Sodium 134 (L) 135 - 145 mmol/L   Potassium 5.6 (H) 3.5 - 5.1 mmol/L   Chloride 112 (H) 101 - 111 mmol/L   CO2 16 (L) 22 - 32 mmol/L   Glucose, Bld 162 (H) 65 - 99 mg/dL   BUN 52 (H) 6 - 20 mg/dL   Creatinine, Ser 2.53 (H) 0.44 - 1.00 mg/dL   Calcium 7.7 (L) 8.9 - 10.3 mg/dL   GFR calc non Af Amer 17 (L) >60 mL/min   GFR calc Af Amer 20 (L) >60 mL/min    Comment: (NOTE) The eGFR has been calculated using the CKD EPI equation. This calculation has not been validated in all clinical situations. eGFR's persistently <  60 mL/min signify possible Chronic Kidney Disease.    Anion gap 6 5 - 15  CBC     Status: Abnormal   Collection Time: 09/09/16  2:34 AM  Result Value Ref Range   WBC 18.2 (H) 4.0 - 10.5 K/uL   RBC 3.33 (L) 3.87 - 5.11 MIL/uL   Hemoglobin 9.9 (L) 12.0 - 15.0 g/dL    Comment: POST TRANSFUSION SPECIMEN   HCT 29.2 (L) 36.0 - 46.0 %   MCV 87.7 78.0 - 100.0 fL   MCH 29.7 26.0 - 34.0 pg   MCHC 33.9 30.0 - 36.0 g/dL   RDW 14.3 11.5 - 15.5 %   Platelets 142 (L) 150 - 400 K/uL  Differential     Status: Abnormal   Collection Time: 09/09/16  2:34 AM  Result Value Ref Range   Neutrophils Relative % 72 %   Neutro Abs 13.2 (H) 1.7 - 7.7 K/uL   Lymphocytes Relative 19 %   Lymphs Abs 3.5 0.7 - 4.0 K/uL   Monocytes Relative 8 %   Monocytes Absolute 1.5 (H) 0.1 - 1.0 K/uL   Eosinophils Relative 0 %   Eosinophils Absolute 0.0 0.0 - 0.7 K/uL   Basophils Relative 0 %   Basophils Absolute 0.0 0.0 - 0.1 K/uL  Prepare RBC     Status: None   Collection Time: 09/09/16  2:36 AM  Result  Value Ref Range   Order Confirmation ORDER PROCESSED BY BLOOD BANK   Glucose, capillary     Status: Abnormal   Collection Time: 09/09/16  7:37 AM  Result Value Ref Range   Glucose-Capillary 144 (H) 65 - 99 mg/dL   Comment 1 Capillary Specimen    Comment 2 Notify RN   Glucose, capillary     Status: Abnormal   Collection Time: 09/09/16 11:23 AM  Result Value Ref Range   Glucose-Capillary 160 (H) 65 - 99 mg/dL   Comment 1 Capillary Specimen    Comment 2 Notify RN   CBC     Status: Abnormal   Collection Time: 09/09/16  2:11 PM  Result Value Ref Range   WBC 13.7 (H) 4.0 - 10.5 K/uL   RBC 3.69 (L) 3.87 - 5.11 MIL/uL   Hemoglobin 11.0 (L) 12.0 - 15.0 g/dL   HCT 31.9 (L) 36.0 - 46.0 %   MCV 86.4 78.0 - 100.0 fL   MCH 29.8 26.0 - 34.0 pg   MCHC 34.5 30.0 - 36.0 g/dL   RDW 14.8 11.5 - 15.5 %   Platelets 89 (L) 150 - 400 K/uL    Comment: DELTA CHECK NOTED SPECIMEN CHECKED FOR CLOTS REPEATED TO VERIFY PLATELETS APPEAR DECREASED PLATELET COUNT CONFIRMED BY SMEAR   Glucose, capillary     Status: Abnormal   Collection Time: 09/09/16  4:33 PM  Result Value Ref Range   Glucose-Capillary 121 (H) 65 - 99 mg/dL   Comment 1 Capillary Specimen    Comment 2 Notify RN   Glucose, capillary     Status: Abnormal   Collection Time: 09/09/16  9:28 PM  Result Value Ref Range   Glucose-Capillary 114 (H) 65 - 99 mg/dL   Comment 1 Capillary Specimen   Basic metabolic panel     Status: Abnormal   Collection Time: 09/10/16  3:24 AM  Result Value Ref Range   Sodium 137 135 - 145 mmol/L   Potassium 4.9 3.5 - 5.1 mmol/L   Chloride 116 (H) 101 - 111 mmol/L   CO2 16 (L) 22 -  32 mmol/L   Glucose, Bld 127 (H) 65 - 99 mg/dL   BUN 49 (H) 6 - 20 mg/dL   Creatinine, Ser 2.07 (H) 0.44 - 1.00 mg/dL   Calcium 7.8 (L) 8.9 - 10.3 mg/dL   GFR calc non Af Amer 22 (L) >60 mL/min   GFR calc Af Amer 25 (L) >60 mL/min    Comment: (NOTE) The eGFR has been calculated using the CKD EPI equation. This calculation has  not been validated in all clinical situations. eGFR's persistently <60 mL/min signify possible Chronic Kidney Disease.    Anion gap 5 5 - 15  CBC     Status: Abnormal   Collection Time: 09/10/16  3:24 AM  Result Value Ref Range   WBC 10.8 (H) 4.0 - 10.5 K/uL   RBC 3.58 (L) 3.87 - 5.11 MIL/uL   Hemoglobin 10.8 (L) 12.0 - 15.0 g/dL   HCT 31.3 (L) 36.0 - 46.0 %   MCV 87.4 78.0 - 100.0 fL   MCH 30.2 26.0 - 34.0 pg   MCHC 34.5 30.0 - 36.0 g/dL   RDW 15.2 11.5 - 15.5 %   Platelets 88 (L) 150 - 400 K/uL    Comment: CONSISTENT WITH PREVIOUS RESULT  Glucose, capillary     Status: Abnormal   Collection Time: 09/10/16  7:51 AM  Result Value Ref Range   Glucose-Capillary 107 (H) 65 - 99 mg/dL  Glucose, capillary     Status: Abnormal   Collection Time: 09/10/16 11:59 AM  Result Value Ref Range   Glucose-Capillary 155 (H) 65 - 99 mg/dL    Studies/Results: No results found.  Medications: I have reviewed the patient's current medications.  Assessment: Anemia, occult positive stool  Plan: Hemodynamically stable, Hb 10.8 today, BUN dropped from 52-49 today. Tolerating heart healthy carb modified diet. On aspirin 81 mg and Plavix 75 mg along with Protonix 40 mg daily. We will sign off from GI, plan to follow the patient as an outpatient. Possible endoscopic evaluation as an outpatient. Continue Protonix 40 mg daily at least for the next 8 weeks.     Ronnette Juniper 09/10/2016, 12:42 PM   Pager [phone] If no answer or after 5 PM call [phone]

## 2016-09-11 ENCOUNTER — Encounter (HOSPITAL_COMMUNITY): Payer: Self-pay | Admitting: Cardiology

## 2016-09-11 DIAGNOSIS — E118 Type 2 diabetes mellitus with unspecified complications: Secondary | ICD-10-CM

## 2016-09-11 LAB — CBC
HCT: 29.9 % — ABNORMAL LOW (ref 36.0–46.0)
HEMOGLOBIN: 9.8 g/dL — AB (ref 12.0–15.0)
MCH: 29.3 pg (ref 26.0–34.0)
MCHC: 32.8 g/dL (ref 30.0–36.0)
MCV: 89.3 fL (ref 78.0–100.0)
PLATELETS: 96 10*3/uL — AB (ref 150–400)
RBC: 3.35 MIL/uL — AB (ref 3.87–5.11)
RDW: 15 % (ref 11.5–15.5)
WBC: 8.1 10*3/uL (ref 4.0–10.5)

## 2016-09-11 LAB — GLUCOSE, CAPILLARY
GLUCOSE-CAPILLARY: 92 mg/dL (ref 65–99)
GLUCOSE-CAPILLARY: 145 mg/dL — AB (ref 65–99)
Glucose-Capillary: 144 mg/dL — ABNORMAL HIGH (ref 65–99)
Glucose-Capillary: 136 mg/dL — ABNORMAL HIGH (ref 65–99)

## 2016-09-11 LAB — BASIC METABOLIC PANEL
Anion gap: 7 (ref 5–15)
BUN: 41 mg/dL — AB (ref 6–20)
CHLORIDE: 113 mmol/L — AB (ref 101–111)
CO2: 17 mmol/L — ABNORMAL LOW (ref 22–32)
Calcium: 7.9 mg/dL — ABNORMAL LOW (ref 8.9–10.3)
Creatinine, Ser: 2.11 mg/dL — ABNORMAL HIGH (ref 0.44–1.00)
GFR, EST AFRICAN AMERICAN: 25 mL/min — AB (ref >60)
GFR, EST NON AFRICAN AMERICAN: 21 mL/min — AB (ref >60)
Glucose, Bld: 104 mg/dL — ABNORMAL HIGH (ref 65–99)
POTASSIUM: 4.2 mmol/L (ref 3.5–5.1)
SODIUM: 137 mmol/L (ref 135–145)

## 2016-09-11 MED ORDER — FUROSEMIDE 10 MG/ML IJ SOLN
40.0000 mg | Freq: Once | INTRAMUSCULAR | Status: AC
  Administered 2016-09-11: 40 mg via INTRAVENOUS
  Filled 2016-09-11: qty 4

## 2016-09-11 MED ORDER — METOPROLOL TARTRATE 12.5 MG HALF TABLET
12.5000 mg | ORAL_TABLET | Freq: Two times a day (BID) | ORAL | Status: DC
  Administered 2016-09-11 – 2016-09-12 (×3): 12.5 mg via ORAL
  Filled 2016-09-11 (×3): qty 1

## 2016-09-11 NOTE — Evaluation (Signed)
Physical Therapy Evaluation Patient Details Name: Grace Romero MRN: [id number] DOB: May 02, 1935 Today's Date: 09/11/2016   History of Present Illness  81 year old female with no prior records available in our system presented via EMS as a code STEMI with inferior ST elevation and bradycardia.  S/p PCI with continued thrombus in the RCA.  Complicated by GI bleed and severe hypotension, bradycardia and decreasd responsiveness on 5/15.  Clinical Impression  Patient presents with decreased independence with mobility due to deconditioning with weakness and imbalance.  She will benefit from skilled PT in the acute setting to allow return home with initial assist and follow up HHPT.     Follow Up Recommendations Home health PT;Supervision/Assistance - 24 hour (initial assist at home)    Equipment Recommendations  Rolling walker with 5" wheels;3in1 (PT)    Recommendations for Other Services       Precautions / Restrictions Precautions Precautions: Fall Restrictions Weight Bearing Restrictions: No      Mobility  Bed Mobility Overal bed mobility: Modified Independent                Transfers Overall transfer level: Needs assistance   Transfers: Sit to/from Stand Sit to Stand: Min guard         General transfer comment: up from w/c then up from toilet with cues for use of grabbar for safety   Ambulation/Gait Ambulation/Gait assistance: Min guard;Supervision Ambulation Distance (Feet): 125 Feet Assistive device: Rolling walker (2 wheeled) Gait Pattern/deviations: Step-through pattern;Decreased stride length     General Gait Details: some difficulty navigating walker, cues for use and proximity  Stairs            Wheelchair Mobility    Modified Rankin (Stroke Patients Only)       Balance Overall balance assessment: Needs assistance   Sitting balance-Leahy Scale: Good     Standing balance support: No upper extremity supported;During functional  activity Standing balance-Leahy Scale: Fair Standing balance comment: washing hands at sink                             Pertinent Vitals/Pain Pain Assessment: No/denies pain    Home Living Family/patient expects to be discharged to:: Private residence Living Arrangements: Alone (daughter here from [place]) Available Help at Discharge: Family;Available 24 hours/day (initially will have daughter here) Type of Home: Apartment Home Access: Level entry     Home Layout: One level Home Equipment: None      Prior Function Level of Independence: Independent               Hand Dominance        Extremity/Trunk Assessment   Upper Extremity Assessment Upper Extremity Assessment: Overall WFL for tasks assessed    Lower Extremity Assessment Lower Extremity Assessment: Generalized weakness       Communication   Communication: No difficulties  Cognition Arousal/Alertness: Awake/alert Behavior During Therapy: WFL for tasks assessed/performed Overall Cognitive Status: Within Functional Limits for tasks assessed                                        General Comments General comments (skin integrity, edema, etc.): patient with some blood in stool, RN aware    Exercises     Assessment/Plan    PT Assessment Patient needs continued PT services  PT Problem List Decreased balance;Decreased knowledge of use  of DME;Decreased activity tolerance;Decreased strength;Decreased mobility;Decreased safety awareness       PT Treatment Interventions DME instruction;Gait training;Therapeutic exercise;Therapeutic activities;Patient/family education;Functional mobility training;Balance training    PT Goals (Current goals can be found in the Care Plan section)  Acute Rehab PT Goals Patient Stated Goal: To return home, then move to [place] PT Goal Formulation: With patient/family Time For Goal Achievement: 09/18/16 Potential to Achieve Goals: Good     Frequency Min 3X/week   Barriers to discharge        Co-evaluation               AM-PAC PT "6 Clicks" Daily Activity  Outcome Measure Difficulty turning over in bed (including adjusting bedclothes, sheets and blankets)?: None Difficulty moving from lying on back to sitting on the side of the bed? : None Difficulty sitting down on and standing up from a chair with arms (e.g., wheelchair, bedside commode, etc,.)?: A Little Help needed moving to and from a bed to chair (including a wheelchair)?: A Little Help needed walking in hospital room?: A Little Help needed climbing 3-5 steps with a railing? : A Little 6 Click Score: 20    End of Session Equipment Utilized During Treatment: Gait belt Activity Tolerance: Patient tolerated treatment well Patient left: in bed;with call bell/phone within reach;with family/visitor present;with bed alarm set   PT Visit Diagnosis: Muscle weakness (generalized) (M62.81);Other abnormalities of gait and mobility (R26.89)    Time: 1610-9604 PT Time Calculation (min) (ACUTE ONLY): 38 min   Charges:   PT Evaluation $PT Eval Moderate Complexity: 1 Procedure PT Treatments $Gait Training: 8-22 mins $Therapeutic Activity: 8-22 mins   PT G CodesSheran Lawless, [place] [phone] 09/11/2016   Elray Mcgregor 09/11/2016, 12:11 PM

## 2016-09-11 NOTE — Progress Notes (Signed)
Progress Note  Patient Name: Grace Romero Date of Encounter: 09/11/2016  Primary Cardiologist: New: Dr. Katrinka Blazing  Patient Profile     81 y.o. female With no prior cardiac history who presented via EMS as a code STEMI with inferior ST elevation and bradycardia on May 13 roughly 10:40 PM. Unfortunately her pain began at 4 PM the previous day with epigastric chest and neck discomfort. She tried to sleep it off, but symptoms persisted.  In the emergency room she was hypotensive with low pressure and 80 mmHg systolic that responded well to bolus of normal saline.  She was taken to cardiac catheter revision LAD which was found to have an occluded RCA with extensive thrombus. Despite PCI to the more proximal portion of the RCA, there is distal embolization with extensive thrombus in the entire RCA with no flow beyond the medication distally.  Subjective   Patient seen and examined this AM.  RN overnight reports no acute events.  She remains off dopamine infusion with normal BP and HR.  No further melena or CP.  She reports good urine output. PT Eval pending.   Inpatient Medications    Scheduled Meds: . aspirin EC  81 mg Oral Daily  . atorvastatin  40 mg Oral q1800  . clopidogrel  75 mg Oral Daily  . insulin aspart  0-15 Units Subcutaneous TID WC  . mouth rinse  15 mL Mouth Rinse BID  . pantoprazole  40 mg Oral BID  . sodium chloride flush  3 mL Intravenous Q12H   Continuous Infusions: . sodium chloride Stopped (09/09/16 2100)  . sodium chloride    . sodium chloride    . DOPamine Stopped (09/10/16 1100)   PRN Meds: sodium chloride, Place/Maintain arterial line **AND** sodium chloride, acetaminophen, metoCLOPramide (REGLAN) injection, ondansetron (ZOFRAN) IV, oxyCODONE-acetaminophen, sodium chloride flush   Vital Signs    Vitals:   09/11/16 0400 09/11/16 0500 09/11/16 0600 09/11/16 0700  BP: (!) 127/48 (!) 136/48 (!) 128/51 (!) 121/50  Pulse: 69 68 71 72  Resp: (!) 21 20  (!) 23 (!) 22  Temp:      TempSrc:      SpO2: 100% 100% 99% 99%  Weight:      Height:        Intake/Output Summary (Last 24 hours) at 09/11/16 0737 Last data filed at 09/11/16 0322  Gross per 24 hour  Intake           638.52 ml  Output              850 ml  Net          -211.48 ml   Filed Weights   09/07/16 2232  Weight: 193 lb 2 oz (87.6 kg)    ECG    No new EKG this morning  Physical Exam   GEN: elderly female, lying in bed, NAD Neck: No JVD Cardiac: RRR, mild 1/6 systolic murmur at LUSB.  Respiratory: Clear to auscultation bilaterally anterior fields. Normal, unlabored effort GI: Soft, non-tender, non-distended, + bowel sounds MS: 1+ LE edema, bruising at R wrist with improvement in mild swelling Neuro:  Nonfocal; awake and oriented 3. Follows commands. Answers questions properly. Psych: Normal affect, normal mood.  Talkative.  Labs    Chemistry  Recent Labs Lab 09/07/16 2135  09/09/16 0234 09/10/16 0324 09/11/16 0322  NA 127*  < > 134* 137 137  K 4.7  < > 5.6* 4.9 4.2  CL 100*  < > 112*  116* 113*  CO2 18*  < > 16* 16* 17*  GLUCOSE 113*  < > 162* 127* 104*  BUN 29*  < > 52* 49* 41*  CREATININE 2.85*  < > 2.53* 2.07* 2.11*  CALCIUM 8.0*  < > 7.7* 7.8* 7.9*  PROT 5.4*  --   --   --   --   ALBUMIN 3.0*  --   --   --   --   AST 118*  --   --   --   --   ALT 25  --   --   --   --   ALKPHOS 55  --   --   --   --   BILITOT 0.4  --   --   --   --   GFRNONAA 15*  < > 17* 22* 21*  GFRAA 17*  < > 20* 25* 25*  ANIONGAP 9  < > 6 5 7   < > = values in this interval not displayed.   Hematology  Recent Labs Lab 09/09/16 1411 09/10/16 0324 09/11/16 0322  WBC 13.7* 10.8* 8.1  RBC 3.69* 3.58* 3.35*  HGB 11.0* 10.8* 9.8*  HCT 31.9* 31.3* 29.9*  MCV 86.4 87.4 89.3  MCH 29.8 30.2 29.3  MCHC 34.5 34.5 32.8  RDW 14.8 15.2 15.0  PLT 89* 88* 96*    Cardiac Enzymes  Recent Labs Lab 09/07/16 2135 09/08/16 0404 09/08/16 1207  TROPONINI 36.62* 44.69*  45.01*   No results for input(s): TROPIPOC in the last 168 hours.   BNP  Recent Labs Lab 09/07/16 2130  BNP 406.2*     DDimer No results for input(s): DDIMER in the last 168 hours.   Radiology    No results found.  Cardiac Studies   Echo: - Left ventricle: The cavity size was normal. There was moderate   focal basal hypertrophy of the septum. Systolic function was   vigorous. The estimated ejection fraction was in the range of 65%   to 70%. There is akinesis of the basal-midinferior myocardium.   Doppler parameters are consistent with abnormal left ventricular   relaxation (grade 1 diastolic dysfunction). - Aortic valve: Trileaflet; mildly thickened, mildly calcified   leaflets. There was mild regurgitation. - Right ventricle: The cavity size was mildly dilated. Wall   thickness was normal. Systolic function was moderately reduced. - Tricuspid valve: There was mild regurgitation.   CARDIAC CATH 09/07/2016: For inferior STEMI: Proximal RCA thrombotic occlusion treated with Synergy DES 3.5 x 24 -> however there is significant thrombus in the RCA with distal embolization into the PDA and RPL with no flow beyond the bifurcation. --> Continued on IV Aggrastat. -- Images personally reviewed  Diagnostic Diagram      Post-Intervention Diagram                      Assessment & Plan    Principal Problem:   Acute ST elevation myocardial infarction (STEMI) of inferior wall (HCC) Active Problems:   DM II (diabetes mellitus, type II), controlled (HCC)   Hypertension   Hyperlipidemia   Acute-on-chronic kidney injury (HCC)   Cardiogenic shock (HCC)   -- Cardiac shock is resolved.   -- Sinus bradycardia with vasovagal episodes have also resolved.  # Inferior STEMI: Proximal RCA thrombotic occlusion treated with Synergy DES 3.5 x 24 with significant thrombus in the RCA with distal embolization # HLD  # Cardiogenic Shock - Resolved # Bradycardia - HR now in 60-70s #  Hx of HTN -  dopamine off since yesterday morning - cardiac rehab, PT eval ordered - continue Plavix - continue Aspirin - start low dose metoprolol tartrate 12.5mg  BID - holding Ace/ARB given her kidney injury  - will give 1 time dose Lasix IV 40mg  given amount of fluids she received and generalized edema. - Reassess tomorrow as to whether she would require a standing dose.  # Blood Loss Anemia # GI Bleed - hemoglobin stable this morning at 9.8.  Monitoring. - GI evaluation appreciated.  Follow up outpatient - continue PPI BID for at least next 8 weeks  # Thrombocytopenia - platelets this morning are 96.  Improving.  Continue to follow  # Acute on Chronic Kidney Disease  - admitted with Creatinine 2.6, peak 2.8.  Stable at 2.1 today.  Unclear baseline level or degree of her renal insufficiency prior to admission.  Acute injury likely multifactorial given contrast, hypotension, and GI bleed.  She is making good UOP  # Non-Anion Gap Metabolic Acidosis - Stable.  Improving with stopping fluids  # Diabetes - on sliding scale, CBGs are acceptable.  # Deconditioning - transfer to telemetry - PT eval pending for dispo needs.  May need CSW consult based on their assessment.   Signed, Gwynn Burly, DO  09/11/2016, 7:37 AM     I have seen, examined and evaluated the patient this AM along with Dr. Earlene Plater (RT) on morning rounds.  After reviewing all the available data and chart, we discussed the patients laboratory, study & physical findings as well as symptoms in detail. I agree with his findings, examination as well as impression recommendations as per our discussion.    Attending adjustments noted in italics.   Jannis looks much better today. She still seems a little bit down and depressed, but physically and clinically looks much better. She is okay for transfer to telemetry. Start low-dose beta blocker and monitor heart rate responsiveness. I do agree with a one-time dose of IV Lasix and  then reassess as she does have some edema. No actual heart failure symptoms however. More third spacing  Her blood counts have drifted down some and not unexpectedly, but are still stable. Platelet count is normalizing. Renal function has plateaued, we'll continue to monitor however her urine output has picked up. Continue twice a day PPI per GI recommendations. Would be nice to get at anytime for twice a day versus converting to once daily.  Start thinking about discharge planning with social work counseling. She may require short-term rehabilitation. Transfer to telemetry today with thoughts to potentially consider discharge as early as Saturday.     Bryan Lemma, M.D., M.S. Interventional Cardiologist   Pager # [phone] Phone # [phone] [address] [place], [place] [address]

## 2016-09-11 NOTE — Care Management Note (Signed)
Case Management Note Donn Pierini RN, BSN Unit 2W-Case Manager--- 2H coverage [phone]  Patient Details  Name: Grace Romero MRN: [id number] Date of Birth: 02/24/1936  Subjective/Objective:   Pt admitted with STEMI s/p cardiac cath                Action/Plan: PTA pt lived at home- CM to follow for d/c needs  Expected Discharge Date:  09/12/16               Expected Discharge Plan:     In-House Referral:     Discharge planning Services  CM Consult  Post Acute Care Choice:    Choice offered to:     DME Arranged:    DME Agency:     HH Arranged:    HH Agency:     Status of Service:  In process, will continue to follow  If discussed at Long Length of Stay Meetings, dates discussed:    Discharge Disposition:   Additional Comments:  09/11/16- 1115- Donn Pierini RN, CM- pt off dopamine- order to tx to tele- PT eval ordered and pending- may need SNF- [place].   Darrold Span, RN 09/11/2016, 11:15 AM

## 2016-09-11 NOTE — Progress Notes (Signed)
CARDIAC REHAB PHASE I   PRE:  Rate/Rhythm: 68 SR  BP:  Sitting: 130/48        SaO2: 100  MODE:  Ambulation: 400 ft   POST:  Rate/Rhythm: 90 SR  BP:  Sitting: 144/47         SaO2: 100 RA  Pt ambulated 400 ft on RA, rolling walker, assist x2, slow, steady gait, tolerated well with no complaints. Pt much better today, able to increase distance dramatically, much more independent. Pt to bed per pt request after walk, call bell within reach. Will follow. Can be x1.   [phone]  Joylene Grapes, RN, BSN 09/11/2016 2:41 PM

## 2016-09-12 LAB — BASIC METABOLIC PANEL
Anion gap: 7 (ref 5–15)
BUN: 38 mg/dL — AB (ref 6–20)
CO2: 18 mmol/L — ABNORMAL LOW (ref 22–32)
CREATININE: 2.26 mg/dL — AB (ref 0.44–1.00)
Calcium: 8.4 mg/dL — ABNORMAL LOW (ref 8.9–10.3)
Chloride: 113 mmol/L — ABNORMAL HIGH (ref 101–111)
GFR calc Af Amer: 23 mL/min — ABNORMAL LOW (ref >60)
GFR, EST NON AFRICAN AMERICAN: 20 mL/min — AB (ref >60)
GLUCOSE: 109 mg/dL — AB (ref 65–99)
POTASSIUM: 4.1 mmol/L (ref 3.5–5.1)
SODIUM: 138 mmol/L (ref 135–145)

## 2016-09-12 LAB — GLUCOSE, CAPILLARY
GLUCOSE-CAPILLARY: 155 mg/dL — AB (ref 65–99)
GLUCOSE-CAPILLARY: 126 mg/dL — AB (ref 65–99)
Glucose-Capillary: 109 mg/dL — ABNORMAL HIGH (ref 65–99)
Glucose-Capillary: 154 mg/dL — ABNORMAL HIGH (ref 65–99)

## 2016-09-12 LAB — CBC
HEMATOCRIT: 31 % — AB (ref 36.0–46.0)
Hemoglobin: 10.2 g/dL — ABNORMAL LOW (ref 12.0–15.0)
MCH: 29.2 pg (ref 26.0–34.0)
MCHC: 32.9 g/dL (ref 30.0–36.0)
MCV: 88.8 fL (ref 78.0–100.0)
PLATELETS: 126 10*3/uL — AB (ref 150–400)
RBC: 3.49 MIL/uL — ABNORMAL LOW (ref 3.87–5.11)
RDW: 14.5 % (ref 11.5–15.5)
WBC: 7.7 10*3/uL (ref 4.0–10.5)

## 2016-09-12 MED ORDER — ZOLPIDEM TARTRATE 5 MG PO TABS
5.0000 mg | ORAL_TABLET | Freq: Every evening | ORAL | Status: DC | PRN
  Administered 2016-09-12 (×2): 5 mg via ORAL
  Filled 2016-09-12 (×2): qty 1

## 2016-09-12 MED ORDER — CARVEDILOL 6.25 MG PO TABS
6.2500 mg | ORAL_TABLET | Freq: Two times a day (BID) | ORAL | Status: DC
  Administered 2016-09-12 – 2016-09-13 (×2): 6.25 mg via ORAL
  Filled 2016-09-12 (×2): qty 1

## 2016-09-12 NOTE — Care Management Note (Signed)
Case Management Note  Patient Details  Name: Grace Romero MRN: [id number] Date of Birth: 09-24-1935  Subjective/Objective:       Admitted with Acute STEMI            Action/Plan: CM talked to patient and her daughter will be staying with her after discharge; PCP is Dr Hyman Hopes with [facility]; has private insurance with Medicare/ BCBS with prescription drug coverage; pharmacy of choice is [facility]; HHC choice offered, patient chose [facility]; [place] with [facility] called for arrangements; DME- patient is requesting a walker and a shower stool at discharge  Expected Discharge Date: possibly 09/13/2016           Expected Discharge Plan:  Home w Home Health Services  Discharge planning Services  CM Consult  Choice offered to:  Patient  DME Arranged:   Rolling walker and shower Stool DME Agency:   [facility]  HH Arranged:  RN, PT HH Agency:  [facility] (now [facility])  Status of Service:  In process, will continue to follow  Reola Mosher [phone] 09/12/2016, 11:19 AM

## 2016-09-12 NOTE — Progress Notes (Signed)
CARDIAC REHAB PHASE I  Pt getting ready to walk with PT. Completed MI/stent education.  Reviewed risk factors, tobacco cessation, MI book, anti-platelet therapy, stent card (unable to locate), activity restrictions, ntg, exercise, heart healthy diet, carb counting, portion control, s/s heart failure, sodium restrictions, daily weights and phase 2 cardiac rehab. Pt verbalized understanding. Pt agrees to phase 2 cardiac rehab referral, will send to [facility]. Pt in bed, call bell within reach.   [phone] Joylene Grapes, RN, BSN 09/12/2016 3:32 PM

## 2016-09-12 NOTE — Care Management Important Message (Signed)
Important Message  Patient Details  Name: SHALEI NEYLON MRN: [id number] Date of Birth: 1935/12/13   Medicare Important Message Given:  Yes    Kyla Balzarine 09/12/2016, 3:18 PM

## 2016-09-12 NOTE — Progress Notes (Addendum)
Progress Note  Patient Name: Grace Romero Date of Encounter: 09/12/2016  Primary Cardiologist: New: Dr. Katrinka Blazing  Patient Profile     81 y.o. female With no prior cardiac history who presented via EMS as a code STEMI with inferior ST elevation and bradycardia on May 13 roughly 10:40 PM. Unfortunately her pain began at 4 PM the previous day with epigastric chest and neck discomfort. She tried to sleep it off, but symptoms persisted.  In the emergency room she was hypotensive with low pressure and 80 mmHg systolic that responded well to bolus of normal saline.  She was taken to cardiac catheter revision LAD which was found to have an occluded RCA with extensive thrombus. Despite PCI to the more proximal portion of the RCA, there is distal embolization with extensive thrombus in the entire RCA with no flow beyond the medication distally.  Subjective   Patient seen and examined this morning.  She reports not sleeping well last night.  Ambien helped about 3 hours.  She worked with PT and cardiac rehab yesterday and did well.  She has no other complaints this morning.   Inpatient Medications    Scheduled Meds: . aspirin EC  81 mg Oral Daily  . atorvastatin  40 mg Oral q1800  . clopidogrel  75 mg Oral Daily  . insulin aspart  0-15 Units Subcutaneous TID WC  . mouth rinse  15 mL Mouth Rinse BID  . metoprolol tartrate  12.5 mg Oral BID  . pantoprazole  40 mg Oral BID  . sodium chloride flush  3 mL Intravenous Q12H   Continuous Infusions: . sodium chloride Stopped (09/09/16 2100)  . sodium chloride     PRN Meds: sodium chloride, Place/Maintain arterial line **AND** sodium chloride, acetaminophen, metoCLOPramide (REGLAN) injection, ondansetron (ZOFRAN) IV, oxyCODONE-acetaminophen, sodium chloride flush, zolpidem   Vital Signs    Vitals:   09/11/16 1722 09/11/16 2117 09/12/16 0238 09/12/16 0813  BP: (!) 134/50 (!) 121/46 (!) 134/43 (!) 139/53  Pulse: (!) 58 80 80 78  Resp: 18  17 18 18   Temp: 99.6 F (37.6 C) 99.5 F (37.5 C) 99 F (37.2 C) 98.7 F (37.1 C)  TempSrc: Oral Oral Oral Oral  SpO2: 96% 98% 99% 98%  Weight:   200 lb 1.6 oz (90.8 kg)   Height:        Intake/Output Summary (Last 24 hours) at 09/12/16 1018 Last data filed at 09/12/16 0925  Gross per 24 hour  Intake              420 ml  Output             1400 ml  Net             -980 ml   Filed Weights   09/07/16 2232 09/11/16 1204 09/12/16 0238  Weight: 193 lb 2 oz (87.6 kg) 204 lb 8 oz (92.8 kg) 200 lb 1.6 oz (90.8 kg)    ECG    No new EKG this morning  Physical Exam   GEN: elderly female, lying in bed, NAD Cardiac: RRR, mild 1/6 systolic murmur at LUSB.  Respiratory: Clear to auscultation bilaterally anterior fields. Normal, unlabored effort GI: Soft, non-tender, non-distended MS: Trace pedal edema, bruising at R wrist.  Swelling at right wrist better Neuro:  Nonfocal; awake and oriented 3. Follows commands. Answers questions properly. Psych:  Talkative.  Slightly depressed mood.  Labs    Chemistry  Recent Labs Lab 09/07/16 2135  09/10/16  0277 09/11/16 0322 09/12/16 0516  NA 127*  < > 137 137 138  K 4.7  < > 4.9 4.2 4.1  CL 100*  < > 116* 113* 113*  CO2 18*  < > 16* 17* 18*  GLUCOSE 113*  < > 127* 104* 109*  BUN 29*  < > 49* 41* 38*  CREATININE 2.85*  < > 2.07* 2.11* 2.26*  CALCIUM 8.0*  < > 7.8* 7.9* 8.4*  PROT 5.4*  --   --   --   --   ALBUMIN 3.0*  --   --   --   --   AST 118*  --   --   --   --   ALT 25  --   --   --   --   ALKPHOS 55  --   --   --   --   BILITOT 0.4  --   --   --   --   GFRNONAA 15*  < > 22* 21* 20*  GFRAA 17*  < > 25* 25* 23*  ANIONGAP 9  < > 5 7 7   < > = values in this interval not displayed.   Hematology  Recent Labs Lab 09/10/16 0324 09/11/16 0322 09/12/16 0516  WBC 10.8* 8.1 7.7  RBC 3.58* 3.35* 3.49*  HGB 10.8* 9.8* 10.2*  HCT 31.3* 29.9* 31.0*  MCV 87.4 89.3 88.8  MCH 30.2 29.3 29.2  MCHC 34.5 32.8 32.9  RDW 15.2 15.0  14.5  PLT 88* 96* 126*    Cardiac Enzymes  Recent Labs Lab 09/07/16 2135 09/08/16 0404 09/08/16 1207  TROPONINI 36.62* 44.69* 45.01*   No results for input(s): TROPIPOC in the last 168 hours.   BNP  Recent Labs Lab 09/07/16 2130  BNP 406.2*     DDimer No results for input(s): DDIMER in the last 168 hours.   Radiology    No results found.  Cardiac Studies   Echo: - Left ventricle: The cavity size was normal. There was moderate   focal basal hypertrophy of the septum. Systolic function was   vigorous. The estimated ejection fraction was in the range of 65%   to 70%. There is akinesis of the basal-midinferior myocardium.   Doppler parameters are consistent with abnormal left ventricular   relaxation (grade 1 diastolic dysfunction). - Aortic valve: Trileaflet; mildly thickened, mildly calcified   leaflets. There was mild regurgitation. - Right ventricle: The cavity size was mildly dilated. Wall   thickness was normal. Systolic function was moderately reduced. - Tricuspid valve: There was mild regurgitation.   CARDIAC CATH 09/07/2016: For inferior STEMI: Proximal RCA thrombotic occlusion treated with Synergy DES 3.5 x 24 -> however there is significant thrombus in the RCA with distal embolization into the PDA and RPL with no flow beyond the bifurcation. --> Continued on IV Aggrastat. -- Images personally reviewed  Diagnostic Diagram      Post-Intervention Diagram                      Assessment & Plan    Principal Problem:   Acute ST elevation myocardial infarction (STEMI) of inferior wall (HCC) Active Problems:   DM II (diabetes mellitus, type II), controlled (HCC)   Hypertension   Hyperlipidemia   Acute-on-chronic kidney injury (HCC)   Cardiogenic shock (HCC)   -- Cardiac shock is resolved.   -- Sinus bradycardia with vasovagal episodes have also resolved.  # Inferior STEMI: Proximal RCA thrombotic occlusion treated  with Synergy DES 3.5 x 24 with  significant thrombus in the RCA with distal embolization # HLD  # Cardiogenic Shock - Resolved # Bradycardia - Resolved - cardiac rehab, PT eval appreciated - continue Plavix - continue Aspirin - continue Atorvastatin, d/c PTA simvastatin at discharge - discontinue metoprolol, start carvedilol 6.25mg  BID - holding Ace/ARB given her kidney injury - her creatinine bumped slightly after Lasix dose x 1 yesterday.  Hold further diuretics today.  Reassess BMET in the morning.  If renal function stable and tolerates carvedilol, anticipate d/c home in the morning - consider reassessing LV function with Echo in approximately 1-2 months.  # HTN - blood pressures have increased.  Transition to carvedilol as above. - d/c PTA verapamil and lisinopril at discharge.  Reassess renal function and BP at follow up re: starting back Ace/ARB  # Blood Loss Anemia # GI Bleed - hemoglobin stable this morning at 10.8.  - GI evaluation appreciated.  Follow up outpatient - continue PPI for at least next 8 weeks.  I spoke with GI this morning to clarify dosing.  Given that she has had no further bleeding and hemoglobin stable, okay to convert to once daily dosing at discharge.  # Thrombocytopenia - platelets this morning are 126.  Improved.  # Acute on Chronic Kidney Disease  - admitted with Creatinine 2.6, peak 2.8.  Increased slightly today after diuresis.  Unclear baseline level or degree of her renal insufficiency prior to admission.  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  # Non-Anion Gap Metabolic Acidosis - Continues to improve.  # Diabetes - on sliding scale, CBGs are acceptable. - can resume home medications at discharge  # Deconditioning - PT recs Home health.  Case management involved with dispo needs.   Signed, Gwynn Burly, DO  09/12/2016, 10:18 AM    I have seen, examined and evaluated the patient this AM along with Dr. Earlene Plater (R2) on  AM rounds.  After reviewing all the available data and chart, we discussed the patients laboratory, study & physical findings as well as symptoms in detail. I agree with his findings, examination as well as impression recommendations as per our discussion.    Attending adjustments noted in italics.   She seems to be slowly recovering from her completed RV/inferior infarct with inferior all factors seem to be improving. Hemoglobin stable, blood pressures improved to the point where we are considering adding back antihypertensive. She tolerated low-dose Lopressor, we will switch to carvedilol for more blood pressure control now with less rate control.  Renal function seems to have stabilized now. I would defer further management to PCP. Blood sugars continue to be stable. Would restart all medications on discharge.  Did well with ambulation. I suspect that if she continues to progress she should be ready for discharge home tomorrow with plans for home health PT.   Bryan Lemma, M.D., M.S. Interventional Cardiologist   Pager # [phone] Phone # [phone] [address] [place], [place] [address]

## 2016-09-12 NOTE — Progress Notes (Signed)
Pt did complain of not having sleep till 2 AM, RN called MD and Ambien provided to the patient, after that pt slept, will continue to monitor the patient.

## 2016-09-12 NOTE — Progress Notes (Signed)
Physical Therapy Treatment Patient Details Name: Grace Romero MRN: [id number] DOB: 12-11-1935 Today's Date: 09/12/2016    History of Present Illness 81 year old female with no prior records available in our system presented via EMS as a code STEMI with inferior ST elevation and bradycardia.  S/p PCI with continued thrombus in the RCA.  Complicated by GI bleed and severe hypotension, bradycardia and decreasd responsiveness on 5/15.    PT Comments    Patient is making good progress with PT.  From a mobility standpoint anticipate patient will be ready for DC home when medically ready.      Follow Up Recommendations  Home health PT;Supervision/Assistance - 24 hour     Equipment Recommendations  Rolling walker with 5" wheels;3in1 (PT)    Recommendations for Other Services       Precautions / Restrictions Precautions Precautions: Fall    Mobility  Bed Mobility Overal bed mobility: Independent                Transfers Overall transfer level: Modified independent Equipment used: Rolling walker (2 wheeled);None Transfers: Sit to/from Stand           General transfer comment: cues for safe hand placement  Ambulation/Gait Ambulation/Gait assistance: Supervision Ambulation Distance (Feet): 200 Feet Assistive device: Rolling walker (2 wheeled) Gait Pattern/deviations: Step-through pattern;Decreased stride length Gait velocity: decreased   General Gait Details: cues for posture, proximity of RW, and forward gaze   Stairs            Wheelchair Mobility    Modified Rankin (Stroke Patients Only)       Balance     Sitting balance-Leahy Scale: Good       Standing balance-Leahy Scale: Good                              Cognition Arousal/Alertness: Awake/alert Behavior During Therapy: WFL for tasks assessed/performed Overall Cognitive Status: Within Functional Limits for tasks assessed                                        Exercises      General Comments General comments (skin integrity, edema, etc.): pt educated on energy conservation techniques and activity progression      Pertinent Vitals/Pain Pain Assessment: No/denies pain    Home Living                      Prior Function            PT Goals (current goals can now be found in the care plan section) Progress towards PT goals: Progressing toward goals    Frequency    Min 3X/week      PT Plan Current plan remains appropriate    Co-evaluation              AM-PAC PT "6 Clicks" Daily Activity  Outcome Measure  Difficulty turning over in bed (including adjusting bedclothes, sheets and blankets)?: None Difficulty moving from lying on back to sitting on the side of the bed? : None Difficulty sitting down on and standing up from a chair with arms (e.g., wheelchair, bedside commode, etc,.)?: None Help needed moving to and from a bed to chair (including a wheelchair)?: None Help needed walking in hospital room?: A Little Help needed climbing 3-5 steps with a railing? : A  Lot 6 Click Score: 21    End of Session Equipment Utilized During Treatment: Gait belt Activity Tolerance: Patient tolerated treatment well Patient left: in chair;with call bell/phone within reach Nurse Communication: Mobility status PT Visit Diagnosis: Muscle weakness (generalized) (M62.81);Other abnormalities of gait and mobility (R26.89)     Time: 1542-1600 PT Time Calculation (min) (ACUTE ONLY): 18 min  Charges:  $Gait Training: 8-22 mins                    G Codes:       Erline Levine, PTA Pager: [phone]     Carolynne Edouard 09/12/2016, 4:18 PM

## 2016-09-13 ENCOUNTER — Encounter (HOSPITAL_COMMUNITY): Payer: Self-pay | Admitting: Physician Assistant

## 2016-09-13 ENCOUNTER — Other Ambulatory Visit: Payer: Self-pay | Admitting: Physician Assistant

## 2016-09-13 DIAGNOSIS — K922 Gastrointestinal hemorrhage, unspecified: Secondary | ICD-10-CM | POA: Diagnosis present

## 2016-09-13 DIAGNOSIS — N189 Chronic kidney disease, unspecified: Secondary | ICD-10-CM | POA: Diagnosis present

## 2016-09-13 DIAGNOSIS — I2510 Atherosclerotic heart disease of native coronary artery without angina pectoris: Secondary | ICD-10-CM | POA: Diagnosis present

## 2016-09-13 DIAGNOSIS — E782 Mixed hyperlipidemia: Secondary | ICD-10-CM

## 2016-09-13 DIAGNOSIS — N184 Chronic kidney disease, stage 4 (severe): Secondary | ICD-10-CM

## 2016-09-13 LAB — CBC
HCT: 28.4 % — ABNORMAL LOW (ref 36.0–46.0)
HEMOGLOBIN: 9.3 g/dL — AB (ref 12.0–15.0)
MCH: 28.9 pg (ref 26.0–34.0)
MCHC: 32.7 g/dL (ref 30.0–36.0)
MCV: 88.2 fL (ref 78.0–100.0)
Platelets: 124 10*3/uL — ABNORMAL LOW (ref 150–400)
RBC: 3.22 MIL/uL — AB (ref 3.87–5.11)
RDW: 14.2 % (ref 11.5–15.5)
WBC: 5.9 10*3/uL (ref 4.0–10.5)

## 2016-09-13 LAB — BASIC METABOLIC PANEL
ANION GAP: 8 (ref 5–15)
BUN: 31 mg/dL — ABNORMAL HIGH (ref 6–20)
CALCIUM: 8.2 mg/dL — AB (ref 8.9–10.3)
CHLORIDE: 112 mmol/L — AB (ref 101–111)
CO2: 18 mmol/L — AB (ref 22–32)
CREATININE: 2.04 mg/dL — AB (ref 0.44–1.00)
GFR calc Af Amer: 26 mL/min — ABNORMAL LOW (ref >60)
GFR calc non Af Amer: 22 mL/min — ABNORMAL LOW (ref >60)
GLUCOSE: 107 mg/dL — AB (ref 65–99)
Potassium: 4.2 mmol/L (ref 3.5–5.1)
Sodium: 138 mmol/L (ref 135–145)

## 2016-09-13 LAB — GLUCOSE, CAPILLARY: GLUCOSE-CAPILLARY: 103 mg/dL — AB (ref 65–99)

## 2016-09-13 MED ORDER — ASPIRIN 81 MG PO TBEC: 81.0000 mg | DELAYED_RELEASE_TABLET | Freq: Every day | ORAL | Status: AC

## 2016-09-13 MED ORDER — ATORVASTATIN CALCIUM 40 MG PO TABS: 40.0000 mg | ORAL_TABLET | Freq: Every day | ORAL | 3 refills | Status: DC

## 2016-09-13 MED ORDER — PANTOPRAZOLE SODIUM 40 MG PO TBEC: 40.0000 mg | DELAYED_RELEASE_TABLET | Freq: Two times a day (BID) | ORAL | 2 refills | Status: AC

## 2016-09-13 MED ORDER — CLOPIDOGREL BISULFATE 75 MG PO TABS: 75.0000 mg | ORAL_TABLET | Freq: Every day | ORAL | 4 refills | Status: AC

## 2016-09-13 MED ORDER — CARVEDILOL 6.25 MG PO TABS: 6.2500 mg | ORAL_TABLET | Freq: Two times a day (BID) | ORAL | 3 refills | Status: AC

## 2016-09-13 NOTE — Progress Notes (Signed)
Spoke to the patient she states that she has RW and shower seat delivered to room already. HH arranged yesterday. No further CM needs. Plan to DC to home today

## 2016-09-13 NOTE — Progress Notes (Signed)
Progress Note  Patient Name: Grace Romero Date of Encounter: 09/13/2016  Primary Cardiologist: Dr. Verdis Prime (new)  Subjective   No chest pain or shortness of breath. No palpitations or dizziness. Eager to go home.  Inpatient Medications    Scheduled Meds: . aspirin EC  81 mg Oral Daily  . atorvastatin  40 mg Oral q1800  . carvedilol  6.25 mg Oral BID WC  . clopidogrel  75 mg Oral Daily  . insulin aspart  0-15 Units Subcutaneous TID WC  . mouth rinse  15 mL Mouth Rinse BID  . pantoprazole  40 mg Oral BID  . sodium chloride flush  3 mL Intravenous Q12H   Continuous Infusions: . sodium chloride Stopped (09/09/16 2100)  . sodium chloride     PRN Meds: sodium chloride, Place/Maintain arterial line **AND** sodium chloride, acetaminophen, metoCLOPramide (REGLAN) injection, ondansetron (ZOFRAN) IV, oxyCODONE-acetaminophen, sodium chloride flush, zolpidem   Vital Signs    Vitals:   09/12/16 0813 09/12/16 1151 09/12/16 1959 09/13/16 0528  BP: (!) 139/53 (!) 125/51 (!) 129/47 (!) 137/58  Pulse: 78 67 75 74  Resp: 18 18 18 18   Temp: 98.7 F (37.1 C) 98.4 F (36.9 C) 98.6 F (37 C)   TempSrc: Oral Oral Oral   SpO2: 98% 100% 98% 97%  Weight:    196 lb 9.6 oz (89.2 kg)  Height:        Intake/Output Summary (Last 24 hours) at 09/13/16 0948 Last data filed at 09/13/16 0931  Gross per 24 hour  Intake             1255 ml  Output              400 ml  Net              855 ml   Filed Weights   09/11/16 1204 09/12/16 0238 09/13/16 0528  Weight: 204 lb 8 oz (92.8 kg) 200 lb 1.6 oz (90.8 kg) 196 lb 9.6 oz (89.2 kg)    Telemetry    Sinus rhythm. Personally reviewed.  Physical Exam   GEN: Elderly woman, no distress.   Neck: No JVD. Cardiac: RRR, soft systolic murmur, no gallop. Respiratory: Nonlabored. Clear to auscultation bilaterally. GI: Soft, nontender, bowel sounds present. MS:  Trace ankle edema; No deformity.  Labs    Chemistry Recent Labs Lab  09/07/16 2135  09/11/16 0322 09/12/16 0516 09/13/16 0605  NA 127*  < > 137 138 138  K 4.7  < > 4.2 4.1 4.2  CL 100*  < > 113* 113* 112*  CO2 18*  < > 17* 18* 18*  GLUCOSE 113*  < > 104* 109* 107*  BUN 29*  < > 41* 38* 31*  CREATININE 2.85*  < > 2.11* 2.26* 2.04*  CALCIUM 8.0*  < > 7.9* 8.4* 8.2*  PROT 5.4*  --   --   --   --   ALBUMIN 3.0*  --   --   --   --   AST 118*  --   --   --   --   ALT 25  --   --   --   --   ALKPHOS 55  --   --   --   --   BILITOT 0.4  --   --   --   --   GFRNONAA 15*  < > 21* 20* 22*  GFRAA 17*  < > 25* 23* 26*  ANIONGAP 9  < >  7 7 8   < > = values in this interval not displayed.   Hematology Recent Labs Lab 09/11/16 0322 09/12/16 0516 09/13/16 0605  WBC 8.1 7.7 5.9  RBC 3.35* 3.49* 3.22*  HGB 9.8* 10.2* 9.3*  HCT 29.9* 31.0* 28.4*  MCV 89.3 88.8 88.2  MCH 29.3 29.2 28.9  MCHC 32.8 32.9 32.7  RDW 15.0 14.5 14.2  PLT 96* 126* 124*    Cardiac Enzymes Recent Labs Lab 09/07/16 2135 09/08/16 0404 09/08/16 1207  TROPONINI 36.62* 44.69* 45.01*   No results for input(s): TROPIPOC in the last 168 hours.   BNP Recent Labs Lab 09/07/16 2130  BNP 406.2*     Radiology    Chest x-ray 09/07/2016: FINDINGS: Low lung volumes. Heart at the upper limits normal in size. Mediastinal contours are normal. Multiple overlying monitoring devices project over the chest. No pulmonary edema. No large pleural effusion or focal airspace disease. No pneumothorax. No acute osseous abnormality.  IMPRESSION: Low lung volumes. Upper normal heart size. No evidence of congestive failure or acute abnormality.  Cardiac Studies   Echocardiogram 09/08/2016: Study Conclusions  - Left ventricle: The cavity size was normal. There was moderate   focal basal hypertrophy of the septum. Systolic function was   vigorous. The estimated ejection fraction was in the range of 65%   to 70%. There is akinesis of the basal-midinferior myocardium.   Doppler parameters  are consistent with abnormal left ventricular   relaxation (grade 1 diastolic dysfunction). - Aortic valve: Trileaflet; mildly thickened, mildly calcified   leaflets. There was mild regurgitation. - Right ventricle: The cavity size was mildly dilated. Wall   thickness was normal. Systolic function was moderately reduced. - Tricuspid valve: There was mild regurgitation.  Impressions:  - RV infarct.  Cardiac catheterization and PCI 09/07/2016:  A STENT PROMUS PREM MR 3.5X24 drug eluting stent was successfully placed, and does not overlap previously placed stent.  Prox RCA to Mid RCA lesion, 100 %stenosed.  Post intervention, there is a 0% residual stenosis.    Late presenting inferior ST elevation myocardial infarction convincing at around 4 PM, 09/06/2016.  Total occlusion of the proximal RCA. The right coronary is collateralized from the left coronary.  Patent LAD and ramus intermedius, each with diffuse disease up to 70%.  Widely patent circumflex and left main coronary arteries.  Successful PTCA and stenting of the proximal total occlusion with reduction in stenosis from 100% to 0%. Distal embolization into the PDA and continuation of the right coronary left the patient with continued total occlusion of the distal major branches.  Left ventricular dysfunction with inferior akinesis, EDP of 20 mmHg, and estimated EF 35-40%.  Patient Profile     81 y.o. female with hypertension, acute on chronic renal insufficiency with probable CK D stage III, type 2 diabetes mellitus, status post presentation with inferior STEMI so with RV infarct due to proximal RCA thrombotic occlusion. She underwent DES intervention with Dr. Katrinka Blazing and did have some distal RCA embolization managed medically. She has improved from cardiogenic shock. Progressing with physical therapy. Plan per Dr. Elissa Hefty note was probable discharged home today.  Assessment & Plan    1. Inferior STEMI associated with RV  infarct and shock due to thrombotic RCA occlusion. Patient status post DES intervention on May 13 by Dr. Katrinka Blazing. Had moderate residual left system disease and also distal embolization within the RCA being managed medically. LVEF 65-70% by echocardiogram and moderate RV dysfunction. She has clinically improved on medical therapy  and progressing with PT.  2. Hyperlipidemia, currently on Lipitor 40 mg daily.  3. GI bleed complicating above presentation. Current hemoglobin 9.3. She has been seen by GI plan to continue PPI for at least 8 weeks, although she states that she was already on Prilosec at baseline as an outpatient.  4. Acute on chronic renal failure, suspect CKD stage 3 at baseline. Creatinine has improved from 2.2-2.0 by follow-up lab work today.  5. Type 2 diabetes mellitus. Was previously on Amaryl and insulin at home.  6. Deconditioning, patient has been progressing with PT with plans to continue home health PT.  7. Essential hypertension. Medications have been adjusted from prior outpatient regimen which included verapamil and lisinopril.  Patient for discharge home today. She states that she will be moving to [place] in the next 2 weeks, need to schedule a follow-up visit in our office with Dr. Katrinka Blazing or APP in the next week. Would resume prior outpatient diabetes regimen. Continue aspirin, Plavix, Lipitor, Coreg, and Protonix 40 mg daily (she had previously been on Prilosec at home). Do not resume previous use of lisinopril or verapamil. Would check follow-up CBC and BMET for her office visit next week.  Signed, Nona Dell, MD  09/13/2016, 9:48 AM

## 2016-09-13 NOTE — Discharge Summary (Signed)
Discharge Summary    Patient ID: Grace Romero,  MRN: [id number], DOB/AGE: Oct 27, 1935 81 y.o.  Admit date: 09/07/2016 Discharge date: 09/13/2016  Primary Care Provider: Shirlean Mylar Primary Cardiologist: Dr. Katrinka Blazing    Discharge Diagnoses    Principal Problem:   Acute ST elevation myocardial infarction (STEMI) of inferior wall (HCC) Active Problems:   DM II (diabetes mellitus, type II), controlled (HCC)   Hypertension   Hyperlipidemia   Acute-on-chronic kidney injury (HCC)   Cardiogenic shock (HCC)   CAD (coronary artery disease)   GI bleed   CKD (chronic kidney disease)   Allergies No Known Allergies   History of Present Illness     81 y.o. female with a history of HTN, HLD, CKD stage III, tobacco abuse and DMT2 who presented to [facility] on 09/07/16 with inferior STEMI, hypotension and bradycardia.   Around 4 PM on 09/06/16, the patient developed epigastric, chest, and neck discomfort. She went to bed and slept off and on. The discomfort lasted severely greater than 6 hours. The follow AM, she discomfort resolved but she felt weak and had pre-syncope. She called EMS and ECG showed inferior STE. She was brought to [facility] cath lab for emergent cardiac catheterization.    Hospital Course     Consultants: none  Inferior STEMI: w/ RV infarct, cardiogenic shock and bradycardia due to thrombotic RCA occlusion. Patient status post DES intervention on 5/13 by Dr. Katrinka Blazing. Had moderate residual left system disease and also distal embolization within the RCA being managed medically. LVEF 65-70% by echocardiogram and moderate RV dysfunction. She has clinically improved on medical therapy and progressing with PT. -- Continue ASA/plavix, statin and BB  Bradycardia: now resolved  HLD: LDL 107. Placed on Lipitor 40 mg daily. Goal <70.   GI bleed: hospital course c/b anemia requiring multiple transfusions. She reported coffee ground emesis and black stools. She has been seen by GI  plan to continue PPI for at least 8 weeks, although she states that she was already on Prilosec at baseline as an outpatient. This will need to be switched to protonix given theoretical interaction with plavix. She will need to establish with GI in [place].  Current hemoglobin 9.3. I have arranged for a CBC next week.   Acute on chronic renal failure: suspect CKD stage 3 at baseline. Creatinine has improved from 2.2--> 2.04. We have discontinued ACEi. I have arranged for a BMET in 1 week    Type 2 diabetes mellitus: HgA1c 6.8. Continue home Amaryl and insulin. Consider adding Jardaince as it has been shown to reduce mortality in those with known CAD in the setting of DMT2  Deconditioning: patient has been progressing with PT with plans to continue home health PT.  Essential hypertension: Medications have been adjusted from prior outpatient regimen which included verapamil and lisinopril. Dr. Diona Browner has recommended that lisinopril and verapamil be discontinued. She is now on Coreg 6.25mg  BID. Follow BP and HR as an outpatient.   Dispo: patient for discharge home today. She states that she will be moving to [place] in the next 2 weeks. I have arranged for follow up with Chauncey Mann PA-C on 09/23/16 @ 8am. I will arrange for a BMET and CBC next week.   The patient has had an uncomplicated hospital course and is recovering well. The radial catheter site is stable. She has been seen by Dr. Diona Browner today and deemed ready for discharge home. All follow-up appointments have been scheduled. Smoking cessation was  disscussed in length. Discharge medications are listed below.  _____________  Discharge Vitals Blood pressure (!) 137/58, pulse 74, temperature 98.6 F (37 C), temperature source Oral, resp. rate 18, height 5\' 2"  (1.575 m), weight 196 lb 9.6 oz (89.2 kg), SpO2 97 %.  Filed Weights   09/11/16 1204 09/12/16 0238 09/13/16 0528  Weight: 204 lb 8 oz (92.8 kg) 200 lb 1.6 oz (90.8 kg)  196 lb 9.6 oz (89.2 kg)    Labs & Radiologic Studies     CBC  Recent Labs  09/12/16 0516 09/13/16 0605  WBC 7.7 5.9  HGB 10.2* 9.3*  HCT 31.0* 28.4*  MCV 88.8 88.2  PLT 126* 124*   Basic Metabolic Panel  Recent Labs  09/12/16 0516 09/13/16 0605  NA 138 138  K 4.1 4.2  CL 113* 112*  CO2 18* 18*  GLUCOSE 109* 107*  BUN 38* 31*  CREATININE 2.26* 2.04*  CALCIUM 8.4* 8.2*   Liver Function Tests No results for input(s): AST, ALT, ALKPHOS, BILITOT, PROT, ALBUMIN in the last 72 hours. No results for input(s): LIPASE, AMYLASE in the last 72 hours. Cardiac Enzymes No results for input(s): CKTOTAL, CKMB, CKMBINDEX, TROPONINI in the last 72 hours. BNP Invalid input(s): POCBNP D-Dimer No results for input(s): DDIMER in the last 72 hours. Hemoglobin A1C No results for input(s): HGBA1C in the last 72 hours. Fasting Lipid Panel No results for input(s): CHOL, HDL, LDLCALC, TRIG, CHOLHDL, LDLDIRECT in the last 72 hours. Thyroid Function Tests No results for input(s): TSH, T4TOTAL, T3FREE, THYROIDAB in the last 72 hours.  Invalid input(s): FREET3  Dg Chest Port 1 View  Result Date: 09/08/2016 CLINICAL DATA:  Myocardial infarction. EXAM: PORTABLE CHEST 1 VIEW COMPARISON:  None. FINDINGS: Low lung volumes. Heart at the upper limits normal in size. Mediastinal contours are normal. Multiple overlying monitoring devices project over the chest. No pulmonary edema. No large pleural effusion or focal airspace disease. No pneumothorax. No acute osseous abnormality. IMPRESSION: Low lung volumes. Upper normal heart size. No evidence of congestive failure or acute abnormality. Electronically Signed   By: Rubye Oaks M.D.   On: 09/08/2016 00:24     Diagnostic Studies/Procedures  Echocardiogram 09/08/2016: Study Conclusions - Left ventricle: The cavity size was normal. There was moderate focal basal hypertrophy of the septum. Systolic function was vigorous. The estimated ejection  fraction was in the range of 65% to 70%. There is akinesis of the basal-midinferior myocardium. Doppler parameters are consistent with abnormal left ventricular relaxation (grade 1 diastolic dysfunction). - Aortic valve: Trileaflet; mildly thickened, mildly calcified leaflets. There was mild regurgitation. - Right ventricle: The cavity size was mildly dilated. Wall thickness was normal. Systolic function was moderately reduced. - Tricuspid valve: There was mild regurgitation. Impressions: - RV infarct.  Cardiac catheterization and PCI 09/07/2016:  A STENT PROMUS PREM MR 3.5X24 drug eluting stent was successfully placed, and does not overlap previously placed stent.  Prox RCA to Mid RCA lesion, 100 %stenosed.  Post intervention, there is a 0% residual stenosis.   Late presenting inferior ST elevation myocardial infarction convincing at around 4 PM, 09/06/2016.  Total occlusion of the proximal RCA. The right coronary is collateralized from the left coronary.  Patent LAD and ramus intermedius, each with diffuse disease up to 70%.  Widely patent circumflex and left main coronary arteries.  Successful PTCA and stenting of the proximal total occlusion with reduction in stenosis from 100% to 0%. Distal embolization into the PDA and continuation of the right coronary  left the patient with continued total occlusion of the distal major branches.  Left ventricular dysfunction with inferior akinesis, EDP of 20 mmHg, and estimated EF 35-40%.  Diagnostic Diagram                                                  Post-Intervention Diagram                   _____________    Disposition   Pt is being discharged home today in good condition.  Follow-up Plans & Appointments    Follow-up Information    [facility] Follow up.   Specialty:  Home Health Services Why:  A home health care nurse and a physical therapist will go to your home Contact information: [address] [place] [address] [place] [address] [phone]        [facility], [place]. Go on 09/23/2016.   Specialty:  Cardiology Why:  @ 8am Contact information: [address] [place] [address] [phone]        [facility]. Go on 09/18/2016.   Why:  Please go to the office lab between 8am and 4 pm for blood work to check your blood counts and kidney function Contact information: [address] [place] [address] [phone]         Discharge Instructions    Amb Referral to Cardiac Rehabilitation    Complete by:  As directed    Diagnosis:   Coronary Stents STEMI        Discharge Medications     Medication List    STOP taking these medications   lisinopril 20 MG tablet Commonly known as:  PRINIVIL,ZESTRIL   omeprazole 20 MG capsule Commonly known as:  PRILOSEC   simvastatin 20 MG tablet Commonly known as:  ZOCOR   verapamil 120 MG 24 hr capsule Commonly known as:  VERELAN PM     TAKE these medications   aspirin 81 MG EC tablet Take 1 tablet (81 mg total) by mouth daily.   atorvastatin 40 MG tablet Commonly known as:  LIPITOR Take 1 tablet (40 mg total) by mouth daily at 6 PM.   carvedilol 6.25 MG tablet Commonly known as:  COREG Take 1 tablet (6.25 mg total) by mouth 2 (two) times daily with a meal.   cholecalciferol 1000 units tablet Commonly known as:  VITAMIN D Take 2,000 Units by mouth daily.   clopidogrel 75 MG tablet Commonly known as:  PLAVIX Take 1 tablet (75 mg total) by mouth daily.   glimepiride 2 MG tablet Commonly known as:  AMARYL Take 2 mg by mouth daily with breakfast.   pantoprazole 40 MG tablet Commonly known as:  PROTONIX Take 1 tablet (40 mg total) by mouth 2 (two) times daily.   Potassium 75 MG Tabs Take 37.5 mg by mouth daily.   PROBIOTIC PO Take 1 tablet by mouth daily.   TRESIBA FLEXTOUCH [place] Inject 51 Units into the skin  every morning.       Aspirin prescribed at discharge? YES High Intensity Statin Prescribed? YES Beta Blocker Prescribed? YES For EF 45% or less, Was ACEI/ARB Prescribed? No, CKD, EF okay  ADP Receptor Inhibitor Prescribed? YES For EF <45%, Aldosterone Inhibitor Prescribed? NO, LV EF okay  Was EF  assessed during THIS hospitalization? YES Was Cardiac Rehab II ordered? (Included Medically managed Patients): YES   Outstanding Labs/Studies   BMET, CBC  Duration of Discharge Encounter   Greater than 30 minutes including physician time.  Signed, Cline Crock PA-C 09/13/2016, 10:44 AM

## 2016-09-13 NOTE — Discharge Instructions (Signed)

## 2016-09-13 NOTE — Progress Notes (Signed)
Patient given discharge instructions and all questions answered.  

## 2016-09-15 ENCOUNTER — Telehealth (HOSPITAL_COMMUNITY): Payer: Self-pay

## 2016-09-15 NOTE — Telephone Encounter (Signed)
Verified Medicare A/B & BCBS insurance benefits through [employer] No Copay or Coinsurance through [place] $1080.00 & Out of Pocket [phone] which neither have been met. BCBS will p/u after Medicare  Reference [phone] & [id number].... KJ

## 2016-09-16 NOTE — Progress Notes (Signed)
Cardiology Office Note    Date:  09/23/2016   ID:  Grace Romero, DOB 10-Jun-1935, MRN [id number]  PCP:  Shirlean Mylar, MD  Cardiologist:  Dr. Katrinka Blazing   Chief Complaint: Hospital follow up for STEMI  History of Present Illness:   Grace Romero is a 81 y.o. female HTN, HLD, CKD stage III, tobacco abuse, DMT2 presents for hospital follow up with new Dx of CAD.   Admitted 09/07/16 with inferior STEMI with RV infarct, cardiogenic shock and bradycardia due to thrombotic RCA occlusion. Patient status post DES intervention on 5/13 by Dr. Katrinka Blazing to [facility]. Marland Kitchen Had moderate residual left system disease and also distal embolization within the RCA being managed medically. LVEF 65-70% by echocardiogram and moderate RV dysfunction. She has clinically improved on medical therapy and progressing with PT. Bradycardia resolved with revascularization. During hospital course she had anemia requiring multiple transfusions. She reported coffee ground emesis and black stools. She has been seen by GI plan to continue PPI for at least 8 weeks. Prilosec changed to protonix due to potential interaction with plavix. Discontinued ACE due to suspected acute on chronic kidney disease, stage III. BP and HR was stable on coreg 6.25mg  BID.   Here for follow up with daughter. She is moving to [place] in the next 2 weeks. He is walking and doing house hold stuff without chest pain or shortness of breath. Her leg edema has been improved. Denies orthopnea, PND, syncope, chest pain, shortness of breath, dizziness, palpitations or coffee ground emesis. She continues to have black stool. Not on Iron therapy. Her bruising has been improved significantly.   Past Medical History:  Diagnosis Date  . CAD (coronary artery disease)    a. 08/2016: inferior STEMI c/b bradcyardia and cardiogenic shock 2/2 RV infarct. S/p DES to RCA  . CKD (chronic kidney disease)   . Diabetes mellitus without complication (HCC)   . GI bleed   .  Hyperlipidemia   . Hypertension     Past Surgical History:  Procedure Laterality Date  . ABDOMINAL HYSTERECTOMY    . CHOLECYSTECTOMY    . CORONARY STENT INTERVENTION N/A 09/07/2016   Procedure: Coronary Stent Intervention;  Surgeon: Lyn Records, MD;  Location: [facility] INVASIVE CV LAB;  Service: Cardiovascular;  Laterality: N/A;  . LEFT HEART CATH AND CORONARY ANGIOGRAPHY N/A 09/07/2016   Procedure: Left Heart Cath and Coronary Angiography;  Surgeon: Lyn Records, MD;  Location: [facility] INVASIVE CV LAB;  Service: Cardiovascular;  Laterality: N/A;    Current Medications: Prior to Admission medications   Medication Sig Start Date End Date Taking? Authorizing Provider  aspirin EC 81 MG EC tablet Take 1 tablet (81 mg total) by mouth daily. 09/13/16   Janetta Hora, PA-C  atorvastatin (LIPITOR) 40 MG tablet Take 1 tablet (40 mg total) by mouth daily at 6 PM. 09/13/16   Janetta Hora, PA-C  carvedilol (COREG) 6.25 MG tablet Take 1 tablet (6.25 mg total) by mouth 2 (two) times daily with a meal. 09/13/16   Janetta Hora, PA-C  cholecalciferol (VITAMIN D) 1000 units tablet Take 2,000 Units by mouth daily.    [provider]  clopidogrel (PLAVIX) 75 MG tablet Take 1 tablet (75 mg total) by mouth daily. 09/13/16   Janetta Hora, PA-C  glimepiride (AMARYL) 2 MG tablet Take 2 mg by mouth daily with breakfast.    [provider]  Insulin Degludec (TRESIBA FLEXTOUCH [place]) Inject 51 Units into the skin every morning.  [provider]  pantoprazole (PROTONIX) 40 MG tablet Take 1 tablet (40 mg total) by mouth 2 (two) times daily. 09/13/16   Janetta Hora, PA-C  Potassium 75 MG TABS Take 37.5 mg by mouth daily.    [provider]  Probiotic Product (PROBIOTIC PO) Take 1 tablet by mouth daily.    [provider]    Allergies:   Patient has no known allergies.   Social History   Social History  . Marital status: Divorced    Spouse name: N/A   . Number of children: N/A  . Years of education: N/A   Social History Main Topics  . Smoking status: Former Smoker    Quit date: 09/07/2016  . Smokeless tobacco: Never Used  . Alcohol use No  . Drug use: No  . Sexual activity: No   Other Topics Concern  . None   Social History Narrative  . None     Family History:  The patient's family history includes Angina in her mother.   ROS:   Please see the history of present illness.    ROS All other systems reviewed and are negative.   PHYSICAL EXAM:   VS:  BP (!) 110/54   Pulse 64   Ht 5\' 2"  (1.575 m)   Wt 191 lb 6.4 oz (86.8 kg)   BMI 35.01 kg/m    GEN: Well nourished, well developed, in no acute distress  HEENT: normal  Neck: no JVD, carotid bruits, or masses Cardiac: RRR; no murmurs, rubs, or gallops,no edema  Respiratory:  clear to auscultation bilaterally, normal work of breathing GI: soft, nontender, nondistended, + BS MS: no deformity or atrophy  Skin: warm and dry, no rash Neuro:  Alert and Oriented x 3, Strength and sensation are intact Psych: euthymic mood, full affect  Wt Readings from Last 3 Encounters:  09/23/16 191 lb 6.4 oz (86.8 kg)  09/13/16 196 lb 9.6 oz (89.2 kg)      Studies/Labs Reviewed:   EKG:  EKG is ordered today.  The ekg ordered today demonstrates NSR with inferior lateral TWI.   Recent Labs: 09/07/2016: ALT 25; B Natriuretic Peptide 406.2 09/13/2016: Hemoglobin 9.3 09/18/2016: BUN 29; Creatinine, Ser 2.13; Platelets 211; Potassium 4.1; Sodium 140   Lipid Panel    Component Value Date/Time   CHOL 165 09/07/2016 2135   TRIG 114 09/07/2016 2135   HDL 35 (L) 09/07/2016 2135   CHOLHDL 4.7 09/07/2016 2135   VLDL 23 09/07/2016 2135   LDLCALC 107 (H) 09/07/2016 2135    Additional studies/ records that were reviewed today include:   Echocardiogram 09/08/2016: Study Conclusions - Left ventricle: The cavity size was normal. There was moderate focal basal hypertrophy of the septum.  Systolic function was vigorous. The estimated ejection fraction was in the range of 65% to 70%. There is akinesis of the basal-midinferior myocardium. Doppler parameters are consistent with abnormal left ventricular relaxation (grade 1 diastolic dysfunction). - Aortic valve: Trileaflet; mildly thickened, mildly calcified leaflets. There was mild regurgitation. - Right ventricle: The cavity size was mildly dilated. Wall thickness was normal. Systolic function was moderately reduced. - Tricuspid valve: There was mild regurgitation. Impressions: - RV infarct.  Cardiac catheterization and PCI 09/07/2016:  A STENT PROMUS PREM MR 3.5X24 drug eluting stent was successfully placed, and does not overlap previously placed stent.  Prox RCA to Mid RCA lesion, 100 %stenosed.  Post intervention, there is a 0% residual stenosis.   Late presenting inferior ST elevation myocardial  infarction convincing at around 4 PM, 09/06/2016.  Total occlusion of the proximal RCA. The right coronary is collateralized from the left coronary.  Patent LAD and ramus intermedius, each with diffuse disease up to 70%.  Widely patent circumflex and left main coronary arteries.  Successful PTCA and stenting of the proximal total occlusion with reduction in stenosis from 100% to 0%. Distal embolization into the PDA and continuation of the right coronary left the patient with continued total occlusion of the distal major branches.  Left ventricular dysfunction with inferior akinesis, EDP of 20 mmHg, and estimated EF 35-40%.    ASSESSMENT & PLAN:    1. CAD s/p PTCA & DES to pRCA - Has moderate residual left system disease and also distal embolization within the RCA being managed medically. LVEF 65-70% by echocardiogram and moderate RV dysfunction. No angina or dyspnea. Continue aspirin, Plavix, statin and Coreg.  2. Anemia - Required multiple transfusions during admission. GI recommended PPI for 8 weeks.  Hgb was 9.3 on discharge. Will repeat today. She will establish care with GI.    3. HTN - Stable on Coreg.  4. Acute on chronic renal failure, suspect CKD stage 3at baseline.  - Discontinued ACE during admission. Scr was 2.04 at discharge. Will repeat BMET today.   5. HLD - 09/07/2016: Cholesterol 165; HDL 35; LDL Cholesterol 107; Triglycerides 114; VLDL 23  - Continue statin. Will need lipid panel and LFTs in 6 weeks.    Medication Adjustments/Labs and Tests Ordered: Current medicines are reviewed at length with the patient today.  Concerns regarding medicines are outlined above.  Medication changes, Labs and Tests ordered today are listed in the Patient Instructions below. Patient Instructions  Medication Instructions:     If you need a refill on your cardiac medications before your next appointment, please call your pharmacy.  Labwork:   Testing/Procedures:   Follow-Up:   Any Other Special Instructions Will Be Listed Below (If Applicable).                                                                                                                                                      Lorelei Pont, [place]  09/23/2016 8:34 AM    [facility] [address] [place], [place], [place]  [address] Phone: [phone]; Fax: [phone]

## 2016-09-18 ENCOUNTER — Other Ambulatory Visit: Payer: Medicare Other | Admitting: *Deleted

## 2016-09-18 DIAGNOSIS — K922 Gastrointestinal hemorrhage, unspecified: Secondary | ICD-10-CM

## 2016-09-18 DIAGNOSIS — N184 Chronic kidney disease, stage 4 (severe): Secondary | ICD-10-CM

## 2016-09-19 LAB — BASIC METABOLIC PANEL
BUN / CREAT RATIO: 14 (ref 12–28)
BUN: 29 mg/dL — AB (ref 8–27)
CALCIUM: 8 mg/dL — AB (ref 8.7–10.3)
CO2: 17 mmol/L — ABNORMAL LOW (ref 18–29)
CREATININE: 2.13 mg/dL — AB (ref 0.57–1.00)
Chloride: 108 mmol/L — ABNORMAL HIGH (ref 96–106)
GFR calc non Af Amer: 22 mL/min/{1.73_m2} — ABNORMAL LOW (ref >59)
GFR, EST AFRICAN AMERICAN: 25 mL/min/{1.73_m2} — AB (ref >59)
Glucose: 110 mg/dL — ABNORMAL HIGH (ref 65–99)
Potassium: 4.1 mmol/L (ref 3.5–5.2)
Sodium: 140 mmol/L (ref 134–144)

## 2016-09-19 LAB — CBC
HEMATOCRIT: 29.6 % — AB (ref 34.0–46.6)
Hemoglobin: 10.1 g/dL — ABNORMAL LOW (ref 11.1–15.9)
MCH: 30.2 pg (ref 26.6–33.0)
MCHC: 34.1 g/dL (ref 31.5–35.7)
MCV: 89 fL (ref 79–97)
Platelets: 211 10*3/uL (ref 150–379)
RBC: 3.34 x10E6/uL — ABNORMAL LOW (ref 3.77–5.28)
RDW: 14.6 % (ref 12.3–15.4)
WBC: 8.1 10*3/uL (ref 3.4–10.8)

## 2016-09-23 ENCOUNTER — Encounter: Payer: Self-pay | Admitting: Physician Assistant

## 2016-09-23 ENCOUNTER — Ambulatory Visit (INDEPENDENT_AMBULATORY_CARE_PROVIDER_SITE_OTHER): Payer: Medicare Other | Admitting: Physician Assistant

## 2016-09-23 VITALS — BP 110/54 | HR 64 | Ht 62.0 in | Wt 191.4 lb

## 2016-09-23 DIAGNOSIS — D500 Iron deficiency anemia secondary to blood loss (chronic): Secondary | ICD-10-CM | POA: Diagnosis not present

## 2016-09-23 DIAGNOSIS — E785 Hyperlipidemia, unspecified: Secondary | ICD-10-CM | POA: Diagnosis not present

## 2016-09-23 DIAGNOSIS — I2111 ST elevation (STEMI) myocardial infarction involving right coronary artery: Secondary | ICD-10-CM

## 2016-09-23 DIAGNOSIS — N189 Chronic kidney disease, unspecified: Secondary | ICD-10-CM

## 2016-09-23 DIAGNOSIS — I10 Essential (primary) hypertension: Secondary | ICD-10-CM

## 2016-09-23 DIAGNOSIS — I2119 ST elevation (STEMI) myocardial infarction involving other coronary artery of inferior wall: Secondary | ICD-10-CM

## 2016-09-23 LAB — CBC
Hematocrit: 25.7 % — ABNORMAL LOW (ref 34.0–46.6)
Hemoglobin: 8.7 g/dL — ABNORMAL LOW (ref 11.1–15.9)
MCH: 29.4 pg (ref 26.6–33.0)
MCHC: 33.9 g/dL (ref 31.5–35.7)
MCV: 87 fL (ref 79–97)
Platelets: 237 10*3/uL (ref 150–379)
RBC: 2.96 x10E6/uL — ABNORMAL LOW (ref 3.77–5.28)
RDW: 14.2 % (ref 12.3–15.4)
WBC: 7.9 10*3/uL (ref 3.4–10.8)

## 2016-09-23 LAB — BASIC METABOLIC PANEL
BUN / CREAT RATIO: 13 (ref 12–28)
BUN: 26 mg/dL (ref 8–27)
CO2: 20 mmol/L (ref 18–29)
Calcium: 8.1 mg/dL — ABNORMAL LOW (ref 8.7–10.3)
Chloride: 112 mmol/L — ABNORMAL HIGH (ref 96–106)
Creatinine, Ser: 1.97 mg/dL — ABNORMAL HIGH (ref 0.57–1.00)
GFR calc Af Amer: 27 mL/min/{1.73_m2} — ABNORMAL LOW (ref >59)
GFR calc non Af Amer: 24 mL/min/{1.73_m2} — ABNORMAL LOW (ref >59)
GLUCOSE: 85 mg/dL (ref 65–99)
Potassium: 4.4 mmol/L (ref 3.5–5.2)
SODIUM: 147 mmol/L — AB (ref 134–144)

## 2016-09-23 MED ORDER — NITROGLYCERIN 0.4 MG SL SUBL: 0.4000 mg | SUBLINGUAL_TABLET | SUBLINGUAL | 3 refills | Status: AC | PRN

## 2016-09-23 NOTE — Patient Instructions (Addendum)
Medication Instructions:   START TAKING NITROGLYCERIN TABLETS 0.4 MG SUBLINGUAL  AS NEEDED FOR CHEST PAIN  If you need a refill on your cardiac medications before your next appointment, please call your pharmacy.  Labwork: BMET AND CBC TODAY    Testing/Procedures:  NONE ORDERED  TODAY   Follow-Up: CONTACT [facility] [id number] AS NEEDED FOR  ANY CARDIAC RELATED SYMPTOMS    Any Other Special Instructions Will Be Listed Below (If Applicable).   Nitroglycerin sublingual tablets What is this medicine? NITROGLYCERIN (nye troe GLI ser in) is a type of vasodilator. It relaxes blood vessels, increasing the blood and oxygen supply to your heart. This medicine is used to relieve chest pain caused by angina. It is also used to prevent chest pain before activities like climbing stairs, going outdoors in cold weather, or sexual activity. This medicine may be used for other purposes; ask your health care provider or pharmacist if you have questions. COMMON BRAND NAME(S): Nitroquick, Nitrostat, Nitrotab What should I tell my health care provider before I take this medicine? They need to know if you have any of these conditions: -anemia -head injury, recent stroke, or bleeding in the brain -liver disease -previous heart attack -an unusual or allergic reaction to nitroglycerin, other medicines, foods, dyes, or preservatives -pregnant or trying to get pregnant -breast-feeding How should I use this medicine? Take this medicine by mouth as needed. At the first sign of an angina attack (chest pain or tightness) place one tablet under your tongue. You can also take this medicine 5 to 10 minutes before an event likely to produce chest pain. Follow the directions on the prescription label. Let the tablet dissolve under the tongue. Do not swallow whole. Replace the dose if you accidentally swallow it. It will help if your mouth is not dry. Saliva around the tablet will help it to dissolve more  quickly. Do not eat or drink, smoke or chew tobacco while a tablet is dissolving. If you are not better within 5 minutes after taking ONE dose of nitroglycerin, call 9-1-1 immediately to seek emergency medical care. Do not take more than 3 nitroglycerin tablets over 15 minutes. If you take this medicine often to relieve symptoms of angina, your doctor or health care professional may provide you with different instructions to manage your symptoms. If symptoms do not go away after following these instructions, it is important to call 9-1-1 immediately. Do not take more than 3 nitroglycerin tablets over 15 minutes. Talk to your pediatrician regarding the use of this medicine in children. Special care may be needed. Overdosage: If you think you have taken too much of this medicine contact a poison control center or emergency room at once. NOTE: This medicine is only for you. Do not share this medicine with others. What if I miss a dose? This does not apply. This medicine is only used as needed. What may interact with this medicine? Do not take this medicine with any of the following medications: -certain migraine medicines like ergotamine and dihydroergotamine (DHE) -medicines used to treat erectile dysfunction like sildenafil, tadalafil, and vardenafil -riociguat This medicine may also interact with the following medications: -alteplase -aspirin -heparin -medicines for high blood pressure -medicines for mental depression -other medicines used to treat angina -phenothiazines like chlorpromazine, mesoridazine, prochlorperazine, thioridazine This list may not describe all possible interactions. Give your health care provider a list of all the medicines, herbs, non-prescription drugs, or dietary supplements you use. Also tell them if you smoke,  drink alcohol, or use illegal drugs. Some items may interact with your medicine. What should I watch for while using this medicine? Tell your doctor or health  care professional if you feel your medicine is no longer working. Keep this medicine with you at all times. Sit or lie down when you take your medicine to prevent falling if you feel dizzy or faint after using it. Try to remain calm. This will help you to feel better faster. If you feel dizzy, take several deep breaths and lie down with your feet propped up, or bend forward with your head resting between your knees. You may get drowsy or dizzy. Do not drive, use machinery, or do anything that needs mental alertness until you know how this drug affects you. Do not stand or sit up quickly, especially if you are an older patient. This reduces the risk of dizzy or fainting spells. Alcohol can make you more drowsy and dizzy. Avoid alcoholic drinks. Do not treat yourself for coughs, colds, or pain while you are taking this medicine without asking your doctor or health care professional for advice. Some ingredients may increase your blood pressure. What side effects may I notice from receiving this medicine? Side effects that you should report to your doctor or health care professional as soon as possible: -blurred vision -dry mouth -skin rash -sweating -the feeling of extreme pressure in the head -unusually weak or tired Side effects that usually do not require medical attention (report to your doctor or health care professional if they continue or are bothersome): -flushing of the face or neck -headache -irregular heartbeat, palpitations -nausea, vomiting This list may not describe all possible side effects. Call your doctor for medical advice about side effects. You may report side effects to FDA at 1-800-FDA-1088. Where should I keep my medicine? Keep out of the reach of children. Store at room temperature between 20 and 25 degrees C (68 and 77 degrees F). Store in Retail buyer. Protect from light and moisture. Keep tightly closed. Throw away any unused medicine after the expiration  date. NOTE: This sheet is a summary. It may not cover all possible information. If you have questions about this medicine, talk to your doctor, pharmacist, or health care provider.  2018 Elsevier/Gold Standard (2013-02-10 17:57:36)

## 2016-09-24 ENCOUNTER — Telehealth: Payer: Self-pay | Admitting: Physician Assistant

## 2016-09-24 NOTE — Telephone Encounter (Signed)
Patient states that she is returning your call. Thanks.

## 2016-09-24 NOTE — Telephone Encounter (Signed)
Returned pt's call.  See result note.  

## 2016-09-24 NOTE — Telephone Encounter (Signed)
-----   Message from [place], [place] sent at 09/24/2016  8:22 AM EDT ----- Scr improved. Hemoglobin decreased to 8.7 from 10.1. Recommended repeat CBC in 1 week. Advised PCP and GI evaluation prior to moving.

## 2016-09-26 ENCOUNTER — Telehealth: Payer: Self-pay | Admitting: Physician Assistant

## 2016-09-26 NOTE — Telephone Encounter (Signed)
Irving Burton ( daughter) is calling to find out what the lab results were on her mom . Please call  Thanks

## 2016-09-26 NOTE — Telephone Encounter (Signed)
Returned pts daughter, Emily's call, no DPR on file, but pt was there and verified dob and gave verbal permission to speak to daughter.  She was just wanting to go over the instructions that were given her mom re: labs.  She was advised to follow up with pcp / gi before she leaves for [place] , but they are leaving tomorrow, 09/27/16, so they was advised to have her mom seen as soon as they got there to repeat cbc and get her f/u for the low hgb.  She verbalized appreciation for the call back and verbalized understanding.

## 2017-10-23 ENCOUNTER — Other Ambulatory Visit: Payer: Self-pay | Admitting: Physician Assistant

## 2017-12-04 ENCOUNTER — Other Ambulatory Visit: Payer: Self-pay | Admitting: Physician Assistant

## 2017-12-04 NOTE — Telephone Encounter (Signed)
Please advise on refill request as patient has not been seen in over a year, was instructed to follow up as needed and per phone notes patient has relocated to [place]. Thanks, MI

## 2018-01-29 ENCOUNTER — Other Ambulatory Visit: Payer: Self-pay | Admitting: Physician Assistant

## 2018-04-23 IMAGING — CR DG CHEST 1V PORT
1 series · 1 of 1 positions shown · non-contrast
Comparison: None.

CLINICAL DATA: Myocardial infarction.

EXAM:
PORTABLE CHEST 1 VIEW

[AP]
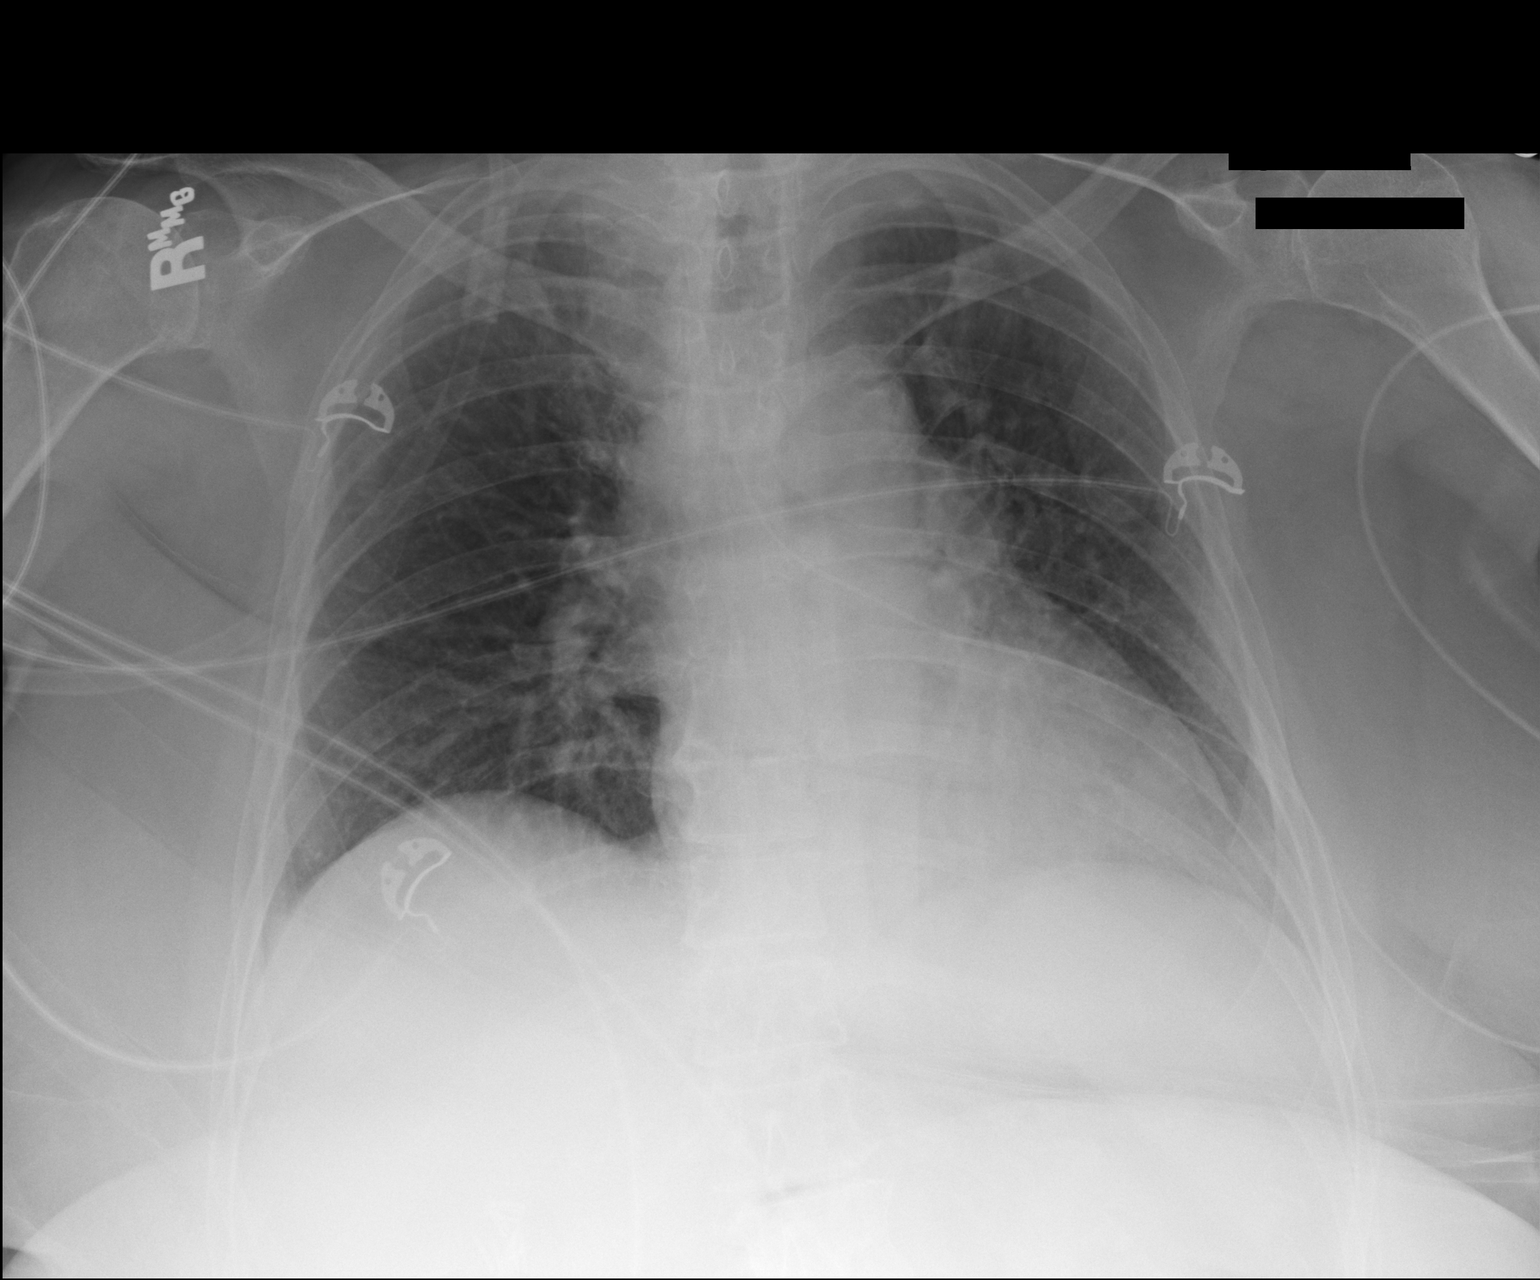

[1 of 1 positions shown; findings below may reference images not displayed]

FINDINGS: Low lung volumes. Heart at the upper limits normal in size.
Mediastinal contours are normal. Multiple overlying monitoring
devices project over the chest. No pulmonary edema. No large pleural
effusion or focal airspace disease. No pneumothorax. No acute
osseous abnormality.
IMPRESSION: Low lung volumes. Upper normal heart size. No evidence of congestive
failure or acute abnormality.

## 2023-07-28 DEATH — deceased
